# Patient Record
Sex: Female | Born: 1995 | Race: White | Hispanic: No | Marital: Single | Smoking: Current some day smoker
Health system: Southern US, Community
[De-identification: ages and names within clinical notes are randomized; demographics above are authoritative.]

## PROBLEM LIST (undated history)

## (undated) ENCOUNTER — Inpatient Hospital Stay (HOSPITAL_COMMUNITY): Payer: Self-pay

## (undated) DIAGNOSIS — Z789 Other specified health status: Secondary | ICD-10-CM

## (undated) DIAGNOSIS — J45909 Unspecified asthma, uncomplicated: Secondary | ICD-10-CM

## (undated) HISTORY — DX: Other specified health status: Z78.9

## (undated) HISTORY — PX: UPPER GI ENDOSCOPY: SHX6162

## (undated) HISTORY — PX: ADENOIDECTOMY: SUR15

## (undated) HISTORY — PX: TONSILLECTOMY: SUR1361

---
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
## 2016-02-07 ENCOUNTER — Encounter (HOSPITAL_COMMUNITY): Payer: Self-pay | Admitting: Emergency Medicine

## 2016-02-07 ENCOUNTER — Emergency Department (HOSPITAL_COMMUNITY)
Admission: EM | Admit: 2016-02-07 | Discharge: 2016-02-07 | Disposition: A | Discharge status: Home / Self Care | Payer: Medicaid Other | Attending: Emergency Medicine | Admitting: Emergency Medicine

## 2016-02-07 DIAGNOSIS — F17210 Nicotine dependence, cigarettes, uncomplicated: Secondary | ICD-10-CM | POA: Diagnosis not present

## 2016-02-07 DIAGNOSIS — B86 Scabies: Secondary | ICD-10-CM | POA: Diagnosis not present

## 2016-02-07 DIAGNOSIS — R21 Rash and other nonspecific skin eruption: Secondary | ICD-10-CM | POA: Diagnosis present

## 2016-02-07 MED ORDER — DIPHENHYDRAMINE HCL 25 MG PO CAPS
25.0000 mg | ORAL_CAPSULE | Freq: Once | ORAL | Status: AC
  Administered 2016-02-07: 25 mg via ORAL
  Filled 2016-02-07: qty 1

## 2016-02-07 MED ORDER — DIPHENHYDRAMINE HCL 25 MG PO CAPS: 25.0000 mg | ORAL_CAPSULE | Freq: Four times a day (QID) | ORAL | 0 refills | Status: DC | PRN

## 2016-02-07 MED ORDER — PERMETHRIN 5 % EX CREA: TOPICAL_CREAM | CUTANEOUS | 0 refills | Status: DC

## 2016-02-07 NOTE — ED Triage Notes (Signed)
PT c/o rash all over x 4-6 months with boyfriend with same symptoms per patient.

## 2016-02-07 NOTE — Discharge Instructions (Signed)
Be sure to wash all clothing and bed lines in hot water after treatment

## 2016-02-07 NOTE — ED Provider Notes (Signed)
[facility]-EMER[facility]ENCY DEPT Provider Note   CSN: [id number] Arrival date & time: 02/07/16  1711  By signing my name below, I, Patricia Vasquez, attest that this documentation has been prepared under the direction and in the presence of Patricia Holmer, PA-C. Electronically Signed: Placido SouLogan Vasquez, ED Scribe. 02/07/16. 7:25 PM.   History   Chief Complaint Chief Complaint  Patient presents with  . Rash    HPI HPI Comments: Patricia Vasquez is a 20 y.o. female who presents to the Emergency Department complaining of constant, moderate, diffuse, rash across her body x 4 months. Pt states the rash began on her BUE and spread across her body. She reports associated itchiness and has healing scratch marks and bruising across her extremities. Pt has applied triple antibiotic and taken benadryl w/o long term relief. Pt denies a hx of scabies. She has a boyfriend who she lives with and is experiencing a similar rash. She denies fever, pain, swelling and sore throat  The history is provided by the patient. No language interpreter was used.   History reviewed. No pertinent past medical history.  Patient Active Problem List   Diagnosis Date Noted  . Breast mass, right 11/05/2013    Past Surgical History:  Procedure Laterality Date  . TONSILLECTOMY      OB History    Gravida Para Term Preterm AB Living   0 0 0 0 0 0   SAB TAB Ectopic Multiple Live Births   0 0 0 0 0       Home Medications    Prior to Admission medications   Not on File    Family History Family History  Problem Relation Age of Onset  . Cancer Paternal Grandfather     lung    Social History Social History  Substance Use Topics  . Smoking status: Current Every Day Smoker    Packs/day: 0.50    Types: Cigarettes  . Smokeless tobacco: Never Used  . Alcohol use No     Allergies   Review of patient's allergies indicates no known allergies.   Review of Systems Review of Systems  Constitutional: Negative for  fever.  HENT: Negative for sore throat and trouble swallowing.   Respiratory: Negative for shortness of breath.   Cardiovascular: Negative for chest pain.  Musculoskeletal: Negative for arthralgias and myalgias.  Skin: Positive for color change, rash and wound.   Physical Exam Updated Vital Signs BP 114/64 (BP Location: Left Arm)   Pulse 78   Temp 98 F (36.7 C) (Oral)   Resp 16   Ht 5\' 2"  (1.575 m)   Wt 100 lb (45.4 kg)   LMP 02/03/2016   SpO2 100%   BMI 18.29 kg/m   Physical Exam  Constitutional: She is oriented to person, place, and time. She appears well-developed and well-nourished.  HENT:  Head: Normocephalic and atraumatic.  Neck: Normal range of motion.  Cardiovascular: Normal rate, regular rhythm and intact distal pulses.   Pulmonary/Chest: Effort normal. No respiratory distress.  Musculoskeletal: Normal range of motion.  Neurological: She is alert and oriented to person, place, and time.  Skin: Skin is warm and dry. Rash noted. There is erythema.  Erythematous papular lesions across most of the body including the webbed spaces of the fingers and toes. Diffuse excoriations also present.  No pustules or vesicles.  Psychiatric: She has a normal mood and affect.  Nursing note and vitals reviewed.  ED Treatments / Results  Labs (all labs ordered are listed, but only  abnormal results are displayed) Labs Reviewed - No data to display  EKG  EKG Interpretation None       Radiology No results found.  Procedures Procedures  DIAGNOSTIC STUDIES: Oxygen Saturation is 100% on RA, normal by my interpretation.    COORDINATION OF CARE: 7:22 PM Discussed next steps with pt. Pt verbalized understanding and is agreeable with the plan.    Medications Ordered in ED Medications - No data to display   Initial Impression / Assessment and Plan / ED Course  I have reviewed the triage vital signs and the nursing notes.  Pertinent labs & imaging results that were  available during my care of the patient were reviewed by me and considered in my medical decision making (see chart for details).  Clinical Course    Pt is well appearing.  Rash c/w scabies.  Pt's significant other also here with similar rash.    I personally performed the services described in this documentation, which was scribed in my presence. The recorded information has been reviewed and is accurate.  Final Clinical Impressions(s) / ED Diagnoses   Final diagnoses:  Scabies    New Prescriptions Discharge Medication List as of 02/07/2016  7:27 PM    START taking these medications   Details  diphenhydrAMINE (BENADRYL) 25 mg capsule Take 1 capsule (25 mg total) by mouth every 6 (six) hours as needed., Starting Mon 02/07/2016, Print    permethrin (ELIMITE) 5 % cream Apply from neck down, massage between fingers and toes.  Leave on for 8-10 hrs. Then wash off.  May be re-applied in 10 days if needed., Print         [name], PA-C 02/09/16 1344    [name], MD 02/09/16 1357

## 2016-07-24 ENCOUNTER — Encounter (INDEPENDENT_AMBULATORY_CARE_PROVIDER_SITE_OTHER): Payer: Self-pay

## 2016-07-24 ENCOUNTER — Ambulatory Visit: Payer: Medicaid Other | Admitting: Adult Health

## 2016-09-07 ENCOUNTER — Encounter: Payer: Self-pay | Admitting: Adult Health

## 2016-09-07 ENCOUNTER — Ambulatory Visit (INDEPENDENT_AMBULATORY_CARE_PROVIDER_SITE_OTHER): Payer: Medicaid Other | Admitting: Adult Health

## 2016-09-07 VITALS — BP 98/60 | HR 72 | Ht 62.0 in | Wt 120.0 lb

## 2016-09-07 DIAGNOSIS — O3680X0 Pregnancy with inconclusive fetal viability, not applicable or unspecified: Secondary | ICD-10-CM

## 2016-09-07 DIAGNOSIS — Z3201 Encounter for pregnancy test, result positive: Secondary | ICD-10-CM | POA: Diagnosis not present

## 2016-09-07 DIAGNOSIS — R110 Nausea: Secondary | ICD-10-CM

## 2016-09-07 DIAGNOSIS — Z3490 Encounter for supervision of normal pregnancy, unspecified, unspecified trimester: Secondary | ICD-10-CM

## 2016-09-07 DIAGNOSIS — F17200 Nicotine dependence, unspecified, uncomplicated: Secondary | ICD-10-CM | POA: Diagnosis not present

## 2016-09-07 DIAGNOSIS — N926 Irregular menstruation, unspecified: Secondary | ICD-10-CM | POA: Diagnosis not present

## 2016-09-07 LAB — POCT URINE PREGNANCY: PREG TEST UR: POSITIVE — AB

## 2016-09-07 MED ORDER — PRENATAL PLUS/IRON 27-1 MG PO TABS: ORAL_TABLET | ORAL | 12 refills | Status: DC

## 2016-09-07 NOTE — Progress Notes (Signed)
Subjective:     Patient ID: Patricia Vasquez, female   DOB: 01/15/1996, 21 y.o.   MRN: [id number]  HPI Patricia Vasquez is a 21 year old white female, in for UPT, has missed a period, has nausea and cramping. No bleeding.  Review of Systems +missed period +cramping +nausea No bleeding Reviewed past medical,surgical, social and family history. Reviewed medications and allergies.     Objective:   Physical Exam BP 98/60 (BP Location: Right Arm, Patient Position: Sitting, Cuff Size: Normal)   Pulse 72   Ht 5\' 2"  (1.575 m)   Wt 120 lb (54.4 kg)   LMP 07/30/2016   BMI 21.95 kg/m    UPT +, about 5+4 weeks by LMP with EDD 05/06/17,Skin warm and dry. Neck: mid line trachea, normal thyroid, good ROM, no lymphadenopathy noted. Lungs: clear to ausculation bilaterally. Cardiovascular: regular rate and rhythm.Abdomen is soft and non tender per NP student.Breasts:no dominate palpable mass, retraction or nipple discharge,has small breasts, but good nipples.  Assessment:     1. Pregnancy test positive   2. Pregnancy, unspecified gestational age   303. Encounter to determine fetal viability of pregnancy, single or unspecified fetus   4. Smoker       Plan:     Meds ordered this encounter  Medications  . Prenatal Vit-Fe Fumarate-FA (PRENATAL PLUS/IRON) 27-1 MG TABS    Sig: Take 1 daily    Dispense:  30 each    Refill:  12    Order Specific Question:   Supervising Provider    Answer:   Duane LopeEURE, LUTHER H [[id number]]  Return in 2 weeks for dating US Eat often  Decrease cigarettes Review handout on first trimester

## 2016-09-07 NOTE — Patient Instructions (Signed)
First Trimester of Pregnancy The first trimester of pregnancy is from week 1 until the end of week 13 (months 1 through 3). A week after a sperm fertilizes an egg, the egg will implant on the wall of the uterus. This embryo will begin to develop into a baby. Genes from you and your partner will form the baby. The female genes will determine whether the baby will be a boy or a girl. At 6-8 weeks, the eyes and face will be formed, and the heartbeat can be seen on ultrasound. At the end of 12 weeks, all the baby's organs will be formed. Now that you are pregnant, you will want to do everything you can to have a healthy baby. Two of the most important things are to get good prenatal care and to follow your health care provider's instructions. Prenatal care is all the medical care you receive before the baby's birth. This care will help prevent, find, and treat any problems during the pregnancy and childbirth. Body changes during your first trimester Your body goes through many changes during pregnancy. The changes vary from woman to woman.  You may gain or lose a couple of pounds at first.  You may feel sick to your stomach (nauseous) and you may throw up (vomit). If the vomiting is uncontrollable, call your health care provider.  You may tire easily.  You may develop headaches that can be relieved by medicines. All medicines should be approved by your health care provider.  You may urinate more often. Painful urination may mean you have a bladder infection.  You may develop heartburn as a result of your pregnancy.  You may develop constipation because certain hormones are causing the muscles that push stool through your intestines to slow down.  You may develop hemorrhoids or swollen veins (varicose veins).  Your breasts may begin to grow larger and become tender. Your nipples may stick out more, and the tissue that surrounds them (areola) may become darker.  Your gums may bleed and may be  sensitive to brushing and flossing.  Dark spots or blotches (chloasma, mask of pregnancy) may develop on your face. This will likely fade after the baby is born.  Your menstrual periods will stop.  You may have a loss of appetite.  You may develop cravings for certain kinds of food.  You may have changes in your emotions from day to day, such as being excited to be pregnant or being concerned that something may go wrong with the pregnancy and baby.  You may have more vivid and strange dreams.  You may have changes in your hair. These can include thickening of your hair, rapid growth, and changes in texture. Some women also have hair loss during or after pregnancy, or hair that feels dry or thin. Your hair will most likely return to normal after your baby is born.  What to expect at prenatal visits During a routine prenatal visit:  You will be weighed to make sure you and the baby are growing normally.  Your blood pressure will be taken.  Your abdomen will be measured to track your baby's growth.  The fetal heartbeat will be listened to between weeks 10 and 14 of your pregnancy.  Test results from any previous visits will be discussed.  Your health care provider may ask you:  How you are feeling.  If you are feeling the baby move.  If you have had any abnormal symptoms, such as leaking fluid, bleeding, severe headaches,   or abdominal cramping.  If you are using any tobacco products, including cigarettes, chewing tobacco, and electronic cigarettes.  If you have any questions.  Other tests that may be performed during your first trimester include:  Blood tests to find your blood type and to check for the presence of any previous infections. The tests will also be used to check for low iron levels (anemia) and protein on red blood cells (Rh antibodies). Depending on your risk factors, or if you previously had diabetes during pregnancy, you may have tests to check for high blood  sugar that affects pregnant women (gestational diabetes).  Urine tests to check for infections, diabetes, or protein in the urine.  An ultrasound to confirm the proper growth and development of the baby.  Fetal screens for spinal cord problems (spina bifida) and Down syndrome.  HIV (human immunodeficiency virus) testing. Routine prenatal testing includes screening for HIV, unless you choose not to have this test.  You may need other tests to make sure you and the baby are doing well.  Follow these instructions at home: Medicines  Follow your health care provider's instructions regarding medicine use. Specific medicines may be either safe or unsafe to take during pregnancy.  Take a prenatal vitamin that contains at least 600 micrograms (mcg) of folic acid.  If you develop constipation, try taking a stool softener if your health care provider approves. Eating and drinking  Eat a balanced diet that includes fresh fruits and vegetables, whole grains, good sources of protein such as meat, eggs, or tofu, and low-fat dairy. Your health care provider will help you determine the amount of weight gain that is right for you.  Avoid raw meat and uncooked cheese. These carry germs that can cause birth defects in the baby.  Eating four or five small meals rather than three large meals a day may help relieve nausea and vomiting. If you start to feel nauseous, eating a few soda crackers can be helpful. Drinking liquids between meals, instead of during meals, also seems to help ease nausea and vomiting.  Limit foods that are high in fat and processed sugars, such as fried and sweet foods.  To prevent constipation: ? Eat foods that are high in fiber, such as fresh fruits and vegetables, whole grains, and beans. ? Drink enough fluid to keep your urine clear or pale yellow. Activity  Exercise only as directed by your health care provider. Most women can continue their usual exercise routine during  pregnancy. Try to exercise for 30 minutes at least 5 days a week. Exercising will help you: ? Control your weight. ? Stay in shape. ? Be prepared for labor and delivery.  Experiencing pain or cramping in the lower abdomen or lower back is a good sign that you should stop exercising. Check with your health care provider before continuing with normal exercises.  Try to avoid standing for long periods of time. Move your legs often if you must stand in one place for a long time.  Avoid heavy lifting.  Wear low-heeled shoes and practice good posture.  You may continue to have sex unless your health care provider tells you not to. Relieving pain and discomfort  Wear a good support bra to relieve breast tenderness.  Take warm sitz baths to soothe any pain or discomfort caused by hemorrhoids. Use hemorrhoid cream if your health care provider approves.  Rest with your legs elevated if you have leg cramps or low back pain.  If you develop   varicose veins in your legs, wear support hose. Elevate your feet for 15 minutes, 3-4 times a day. Limit salt in your diet. Prenatal care  Schedule your prenatal visits by the twelfth week of pregnancy. They are usually scheduled monthly at first, then more often in the last 2 months before delivery.  Write down your questions. Take them to your prenatal visits.  Keep all your prenatal visits as told by your health care provider. This is important. Safety  Wear your seat belt at all times when driving.  Make a list of emergency phone numbers, including numbers for family, friends, the hospital, and police and fire departments. General instructions  Ask your health care provider for a referral to a local prenatal education class. Begin classes no later than the beginning of month 6 of your pregnancy.  Ask for help if you have counseling or nutritional needs during pregnancy. Your health care provider can offer advice or refer you to specialists for help  with various needs.  Do not use hot tubs, steam rooms, or saunas.  Do not douche or use tampons or scented sanitary pads.  Do not cross your legs for long periods of time.  Avoid cat litter boxes and soil used by cats. These carry germs that can cause birth defects in the baby and possibly loss of the fetus by miscarriage or stillbirth.  Avoid all smoking, herbs, alcohol, and medicines not prescribed by your health care provider. Chemicals in these products affect the formation and growth of the baby.  Do not use any products that contain nicotine or tobacco, such as cigarettes and e-cigarettes. If you need help quitting, ask your health care provider. You may receive counseling support and other resources to help you quit.  Schedule a dentist appointment. At home, brush your teeth with a soft toothbrush and be gentle when you floss. Contact a health care provider if:  You have dizziness.  You have mild pelvic cramps, pelvic pressure, or nagging pain in the abdominal area.  You have persistent nausea, vomiting, or diarrhea.  You have a bad smelling vaginal discharge.  You have pain when you urinate.  You notice increased swelling in your face, hands, legs, or ankles.  You are exposed to fifth disease or chickenpox.  You are exposed to German measles (rubella) and have never had it. Get help right away if:  You have a fever.  You are leaking fluid from your vagina.  You have spotting or bleeding from your vagina.  You have severe abdominal cramping or pain.  You have rapid weight gain or loss.  You vomit blood or material that looks like coffee grounds.  You develop a severe headache.  You have shortness of breath.  You have any kind of trauma, such as from a fall or a car accident. Summary  The first trimester of pregnancy is from week 1 until the end of week 13 (months 1 through 3).  Your body goes through many changes during pregnancy. The changes vary from  woman to woman.  You will have routine prenatal visits. During those visits, your health care provider will examine you, discuss any test results you may have, and talk with you about how you are feeling. This information is not intended to replace advice given to you by your health care provider. Make sure you discuss any questions you have with your health care provider. Document Released: 06/06/2001 Document Revised: 05/24/2016 Document Reviewed: 05/24/2016 Elsevier Interactive Patient Education  2017 [employer]   Inc.  

## 2016-09-18 ENCOUNTER — Telehealth: Payer: Self-pay | Admitting: Women's Health

## 2016-09-18 NOTE — Telephone Encounter (Signed)
Pt's mother states the PNV are too big for pt to swallow, I advised she can OTC prenatal gummy vitamins or two Childrens Flintstone vitamins daily.  Pt's mom voiced understanding and states pt is wanting gummies.

## 2016-09-18 NOTE — Telephone Encounter (Signed)
Pt mom called and said the prenatal vitamins are to big for her to swallow is there something else she can take

## 2016-09-21 ENCOUNTER — Ambulatory Visit (INDEPENDENT_AMBULATORY_CARE_PROVIDER_SITE_OTHER): Payer: Medicaid Other

## 2016-09-21 DIAGNOSIS — O3680X0 Pregnancy with inconclusive fetal viability, not applicable or unspecified: Secondary | ICD-10-CM | POA: Diagnosis not present

## 2016-09-21 NOTE — Progress Notes (Addendum)
US 6+1 wks,single IUP w/ys,pos fht 103 bpm,normal left ovary,right ovary was not visualized,right adnexa wnl,crl 4.98 mm,edd 05/16/2017 by [place]

## 2016-10-05 ENCOUNTER — Encounter: Payer: Self-pay | Admitting: Women's Health

## 2016-10-05 ENCOUNTER — Ambulatory Visit: Payer: Medicaid Other | Admitting: *Deleted

## 2016-10-05 ENCOUNTER — Other Ambulatory Visit (INDEPENDENT_AMBULATORY_CARE_PROVIDER_SITE_OTHER): Payer: Medicaid Other

## 2016-10-05 ENCOUNTER — Other Ambulatory Visit: Payer: Self-pay | Admitting: Women's Health

## 2016-10-05 ENCOUNTER — Ambulatory Visit (INDEPENDENT_AMBULATORY_CARE_PROVIDER_SITE_OTHER): Payer: Medicaid Other | Admitting: Women's Health

## 2016-10-05 VITALS — BP 90/52 | HR 86 | Wt 117.0 lb

## 2016-10-05 DIAGNOSIS — O3680X0 Pregnancy with inconclusive fetal viability, not applicable or unspecified: Secondary | ICD-10-CM

## 2016-10-05 DIAGNOSIS — Z3401 Encounter for supervision of normal first pregnancy, first trimester: Secondary | ICD-10-CM

## 2016-10-05 DIAGNOSIS — F17200 Nicotine dependence, unspecified, uncomplicated: Secondary | ICD-10-CM | POA: Diagnosis not present

## 2016-10-05 DIAGNOSIS — Z3A08 8 weeks gestation of pregnancy: Secondary | ICD-10-CM

## 2016-10-05 DIAGNOSIS — Z1389 Encounter for screening for other disorder: Secondary | ICD-10-CM

## 2016-10-05 DIAGNOSIS — O021 Missed abortion: Secondary | ICD-10-CM | POA: Insufficient documentation

## 2016-10-05 DIAGNOSIS — Z331 Pregnant state, incidental: Secondary | ICD-10-CM

## 2016-10-05 DIAGNOSIS — Z3682 Encounter for antenatal screening for nuchal translucency: Secondary | ICD-10-CM

## 2016-10-05 LAB — POCT URINALYSIS DIPSTICK
Blood, UA: NEGATIVE
Glucose, UA: NEGATIVE
LEUKOCYTES UA: NEGATIVE
NITRITE UA: NEGATIVE
PROTEIN UA: NEGATIVE

## 2016-10-05 NOTE — Progress Notes (Addendum)
  Subjective:  Patricia Vasquez is a 21 y.o. G7P0000 Caucasian female at [redacted]w[redacted]d by 6wk u/s, being seen today for her first obstetrical visit.  Her obstetrical history is significant for smoker: 1/2ppd prior to pregnancy, now 5 cigarettes/day- thinks she can quit on her own; primigravida.  Pregnancy history fully reviewed.  Patient reports pulled muscle in her butt- not sure how she did it. Denies vb, cramping, uti s/s, abnormal/malodorous vag d/c, or vulvovaginal itching/irritation.  BP (!) 90/52   Pulse 86   Wt 117 lb (53.1 kg)   LMP 07/30/2016   BMI 21.40 kg/m   HISTORY: OB History  Gravida Para Term Preterm AB Living  1 0 0 0 0 0  SAB TAB Ectopic Multiple Live Births  0 0 0 0 0    # Outcome Date GA Lbr Len/2nd Weight Sex Delivery Anes PTL Lv  1 Current              Past Medical History:  Diagnosis Date  . Medical history non-contributory    Past Surgical History:  Procedure Laterality Date  . ADENOIDECTOMY    . TONSILLECTOMY     Family History  Problem Relation Age of Onset  . Hypertension Father   . Cancer Maternal Grandfather     luekemia  . Cancer Paternal Grandfather     lung    Exam   System:     General: Well developed & nourished, no acute distress   Skin: Warm & dry, normal coloration and turgor, no rashes   Neurologic: Alert & oriented, normal mood   Cardiovascular: Regular rate & rhythm   Respiratory: Effort & rate normal, LCTAB, acyanotic   Abdomen: Soft, non tender   Extremities: normal strength, tone   Thin prep pap smear n/a <21yo  FHR: unable to see w/ mobile transabdominal u/s, declines vag, will work in w/ amber   Assessment:   Pregnancy: G1P0000 Patient Active Problem List   Diagnosis Date Noted  . Breast mass, right 11/05/2013    Priority: High  . Supervision of normal first pregnancy 10/05/2016  . Smoker 09/07/2016    [redacted]w[redacted]d G1P0000 New OB visit Smoker  Plan:  Initial labs obtained Continue prenatal vitamins Problem list  reviewed and updated Reviewed n/v relief measures and warning s/s to report Reviewed recommended weight gain based on pre-gravid BMI Encouraged well-balanced diet Genetic Screening discussed Integrated Screen: requested Cystic fibrosis screening discussed declined Ultrasound discussed; fetal survey: requested Follow up in 4 weeks for visit and 1st it/nt CCNC completed Smokes 5cigarettes/day, advised cessation, discussed risks to fetus while pregnant, to infant pp, and to herself. Offered QuitlineNC, declined   Marge Duncans CNM, [facility] 10/05/2016 3:09 PM   1615: No FCA on formal u/s w/ amber. Missed Ab w/ CRL measuring [redacted]w[redacted]d. Pt notified. Discussed expectant management vs. cytotec, wants to go home and think about it and let us know. Will call back if decides for cytotec. F/u in 1wk either way. Discussed what to expect w/ miscarriage and gave printed info. Cheral Marker, CNM, [facility] 10/05/2016 5:17 PM

## 2016-10-05 NOTE — Patient Instructions (Addendum)
FACTS YOU SHOULD KNOW  About Early Pregnancy Loss  WHAT IS AN EARLY PREGNANCY LOSS? Once the egg is fertilized with the sperm and begins to develop, it attaches to the lining of the uterus. This early pregnancy tissue may not develop into an embryo (the beginning stage of a baby). Sometimes an embryo does develop but does not continue to grow. These problems can be seen on ultrasound.   MANAGEMNT OF EARLY PREGNANCY LOSS: About 4 out of 100 (0.25%) women will have a pregnancy loss in her lifetime.  One in five pregnancies is found to be an early pregnancy loss.  There are 3 ways to care for an early pregnancy loss:   (1) Surgery, (2) Medicine, (3) Waiting for you to pass the pregnancy on your own. The decision as to how to proceed after being diagnosed with and early pregnancy loss is an individual one.  The decision can be made only after appropriate counseling.  You need to weigh the pros and cons of the 3 choices. Then you can make the choice that works for you.  SURGERY (D&E) . Procedure over in 1 day . Requires being put to sleep . Bleeding may be light . Possible problems during surgery, including injury to womb(uterus) . Care provider has more control Medicine (CYTOTEC) . The complete procedure may take days to weeks . No Surgery . Bleeding may be heavy at times . There may be drug side effects . Patient has more control Waiting . You may choose to wait, in which case your own body may complete the passing of the abnormal early pregnancy on its own in about 2-4 weeks . Your bleeding may be heavy at times . There is a small possibility that you may need surgery if the bleeding is too much or not all of the pregnancy has passed.  CYTOTEC MANAGEMENT Prostaglandins (cytotec) are the most widely used drug for this purpose. They cause the uterus to cramp and contract. You will place the medicine yourself inside your vagina in the privacy of your home. Empting of the uterus should occur  within 3 days but the process may continue for several weeks. The bleeding may seem heavy at times.  INSTRUCTIONS: Take all 4 tablets of cytotec (800mcg total) at one time. This will cause a lot of cramping, you may have bleeding, and pass tissue, then the cramping and bleeding should get better. If you do not pass the tissue, then you can take 4 more tablets of cytotec (800mcg total) 48 hours after your first dose.  You will come back to have your blood drawn to make sure the pregnancy hormones are dropping in 1 week. Please call us if you have any questions.   POSSIBLE SIDE EFFECTS FROM CYTOTEC . Nausea  Vomiting . Diarrhea Fever . Chills  Hot Flashes Side effects  from the process of the early pregnancy loss include: . Cramping  Bleeding . Headaches  Dizziness RISKS: This is a low risk procedure. Less than 1 in 100 women has a complication. An incomplete passage of the early pregnancy may occur. Also, hemorrhage (heavy bleeding) could happen.  Rarely the pregnancy will not be passed completely. Excessively heavy bleeding may occur.  Your doctor may need to perform surgery to empty the uterus (D&E). Afterwards: Everybody will feel differently after the early pregnancy loss completion. You may have soreness or cramps for a day or two. You may have soreness or cramps for day or two.  You may have light   bleeding for up to 2 weeks. You may be as active as you feel like being. If you have any of the following problems you may call [facility] at [phone] or Maternity Admissions Unit at [phone] if it is after hours. . If you have pain that does not get better with pain medication . Bleeding that soaks through 2 thick full-sized sanitary pads in an hour . Cramps that last longer than 2 days . Foul smelling discharge . Fever above 100.4 degrees F Even if you do not have any of these symptoms, you should have a follow-up exam to make sure you are healing properly. Your next normal period will  usually start again in 4-6 week after the loss. You can get pregnant soon after the loss, so use birth control right away. Finally: Make sure all your questions are answered before during and after any procedure. Follow up with medical care and family planning methods.      

## 2016-10-05 NOTE — Addendum Note (Signed)
Addended by: Cheral Marker on: 10/05/2016 05:18 PM   Modules accepted: Orders, Level of Service

## 2016-10-05 NOTE — Progress Notes (Addendum)
[place] 8+1 wks,CRL=7+5wks,NO FHT,crl 13.69 mm,bilat adnexa's wnl,subchorionic hemorrhage 3.2 x 1.7 x 1.5 cm,pt is seeing Kim following ultrasound

## 2016-10-06 ENCOUNTER — Telehealth: Payer: Self-pay | Admitting: *Deleted

## 2016-10-06 LAB — ABO/RH: Rh Factor: POSITIVE

## 2016-10-07 LAB — GC/CHLAMYDIA PROBE AMP
Chlamydia trachomatis, NAA: NEGATIVE
Neisseria gonorrhoeae by PCR: NEGATIVE

## 2016-10-12 ENCOUNTER — Encounter: Payer: Self-pay | Admitting: Women's Health

## 2016-10-12 ENCOUNTER — Ambulatory Visit (INDEPENDENT_AMBULATORY_CARE_PROVIDER_SITE_OTHER): Payer: Medicaid Other | Admitting: Women's Health

## 2016-10-12 VITALS — BP 90/60 | HR 80 | Wt 118.0 lb

## 2016-10-12 DIAGNOSIS — O021 Missed abortion: Secondary | ICD-10-CM | POA: Diagnosis not present

## 2016-10-12 DIAGNOSIS — D649 Anemia, unspecified: Secondary | ICD-10-CM | POA: Diagnosis not present

## 2016-10-12 LAB — POCT HEMOGLOBIN: HEMOGLOBIN: 11.4 g/dL — AB (ref 12.2–16.2)

## 2016-10-12 NOTE — Progress Notes (Signed)
   [facility] Visit  Patient name: Patricia Vasquez MRN [id number]  Date of birth: 1995/08/04  CC & HPI:  Patricia Vasquez is a 21 y.o. G60P0000 Caucasian female presenting today for f/u missed Ab dx 1wk ago at new ob visit. CRL at that time [redacted]w[redacted]d. No cramping/spotting. Wants to give it 1 more week to see if her body will start process. Reports h/o anemia, and red flat itchy 'rash' lower legs in past- not current. Discussed this sounds like possible thrombocytopenia. CBC was not resulted w/ last weeks labs. Does not want labs drawn today. Does want to try for another pregnancy after this is complete.  Patient's last menstrual period was 07/30/2016.  Pertinent History Reviewed:  Medical & Surgical Hx:   Past medical, surgical, family, and social history reviewed in electronic medical record Medications: Reviewed & Updated - see associated section Allergies: Reviewed in electronic medical record  Objective Findings:  Vitals: BP 90/60   Pulse 80   Wt 118 lb (53.5 kg)   LMP 07/30/2016   Breastfeeding? Unknown   BMI 21.58 kg/m  Body mass index is 21.58 kg/m.  Physical Examination: General appearance - alert, well appearing, and in no distress  No results found for this or any previous visit (from the past 24 hour(s)).  Fingerstick Hgb 11.4  Assessment & Plan:  A:   Missed Ab dx 1wk ago w/ CRL [redacted]w[redacted]d  P:  OK to continue 1 more week of expectant management, will f/u in 1wk, if miscarriage still not complete at that time will recommend cytotec.   Discussed what to expect w/ miscarriage, s/s infection, reasons to seek care  Return in about 1 week (around 10/19/2016) for F/U.  Marge Duncans CNM, [facility] 10/12/2016 3:46 PM

## 2016-10-12 NOTE — Addendum Note (Signed)
Addended by: Colen Darling on: 10/12/2016 04:26 PM   Modules accepted: Orders

## 2016-10-12 NOTE — Patient Instructions (Signed)

## 2016-10-19 ENCOUNTER — Ambulatory Visit (INDEPENDENT_AMBULATORY_CARE_PROVIDER_SITE_OTHER): Payer: Medicaid Other | Admitting: Advanced Practice Midwife

## 2016-10-19 ENCOUNTER — Encounter: Payer: Self-pay | Admitting: Advanced Practice Midwife

## 2016-10-19 VITALS — BP 110/80 | HR 80 | Wt 116.0 lb

## 2016-10-19 DIAGNOSIS — O021 Missed abortion: Secondary | ICD-10-CM

## 2016-10-19 LAB — POCT HEMOGLOBIN: Hemoglobin: 12.3 g/dL (ref 12.2–16.2)

## 2016-10-19 MED ORDER — MISOPROSTOL 200 MCG PO TABS: 400.0000 ug | ORAL_TABLET | Freq: Once | ORAL | 1 refills | Status: DC

## 2016-10-19 MED ORDER — KETOROLAC TROMETHAMINE 10 MG PO TABS: 10.0000 mg | ORAL_TABLET | Freq: Four times a day (QID) | ORAL | 0 refills | Status: DC | PRN

## 2016-10-19 NOTE — Progress Notes (Signed)
[facility] Visit  Patient name: Patricia Vasquez MRN [id number]  Date of birth: 1995-12-23  CC & HPI:  Patricia Vasquez is a 21 y.o. Caucasian female presenting today for F/U Missed ab. Bleeding "like a period" for 3 days, terrible cramps. Has not had clots/tissue. Refused pelvic exam  Pertinent History Reviewed:  Medical & Surgical Hx:   Past Medical History:  Diagnosis Date  . Medical history non-contributory    Past Surgical History:  Procedure Laterality Date  . ADENOIDECTOMY    . TONSILLECTOMY     Family History  Problem Relation Age of Onset  . Hypertension Father   . Cancer Maternal Grandfather     luekemia  . Cancer Paternal Grandfather     lung    Current Outpatient Prescriptions:  .  Prenatal Vit-Fe Fumarate-FA (PRENATAL PLUS/IRON) 27-1 MG TABS, Take 1 daily, Disp: 30 each, Rfl: 12 .  ketorolac (TORADOL) 10 MG tablet, Take 1 tablet (10 mg total) by mouth every 6 (six) hours as needed., Disp: 20 tablet, Rfl: 0 .  misoprostol (CYTOTEC) 200 MCG tablet, Take 2 tablets (400 mcg total) by mouth once., Disp: 2 tablet, Rfl: 1 Social History: Reviewed -  reports that she has been smoking Cigarettes.  She has been smoking about 0.50 packs per day. She has never used smokeless tobacco.  Review of Systems:   Constitutional: Negative for fever and chills Eyes: Negative for visual disturbances Respiratory: Negative for shortness of breath, dyspnea Cardiovascular: Negative for chest pain or palpitations  Gastrointestinal: Negative for vomiting, diarrhea and constipation;  Genitourinary: Negative for dysuria and urgency, vaginal irritation or itching Musculoskeletal: Negative for back pain, joint pain, myalgias  Neurological: Negative for dizziness and headaches    Objective Findings:    Physical Examination: General appearance - well appearing, and in no distress Mental status - alert, oriented to person, place, and time Chest:  Normal respiratory  effort Heart - normal rate and regular rhythm Abdomen:  Soft, nontender Pelvic: refused.  Small amount of blood on pad Musculoskeletal:  Normal range of motion without pain Extremities:  No edema    No results found for this or any previous visit (from the past 24 hour(s)).    Assessment & Plan:  A:   Missed AB< possibly having SAB now P:  Rx for cytotec.  PT still wants to wait to see if she completes SAB.  Encouraged to take cytotec if bleeding becomes heavy. Bleeding precautions discussed. HgB 13   Return in about 1 week (around 10/26/2016) for [place] to see if SAB is complete.  CRESENZO-DISHMAN,Yachet Mattson CNM 10/19/2016 5:08 PM

## 2016-10-20 ENCOUNTER — Other Ambulatory Visit: Payer: Self-pay | Admitting: Advanced Practice Midwife

## 2016-10-20 DIAGNOSIS — Z7689 Persons encountering health services in other specified circumstances: Secondary | ICD-10-CM

## 2016-10-25 ENCOUNTER — Ambulatory Visit (INDEPENDENT_AMBULATORY_CARE_PROVIDER_SITE_OTHER): Payer: Medicaid Other

## 2016-10-25 ENCOUNTER — Other Ambulatory Visit: Payer: Self-pay | Admitting: Advanced Practice Midwife

## 2016-10-25 DIAGNOSIS — Z7689 Persons encountering health services in other specified circumstances: Secondary | ICD-10-CM

## 2016-10-25 NOTE — Progress Notes (Signed)
[place] 8+2 wks GS (3.1 x 3.2 x 1.9 cm) within the lower uterine segment,crl 11 mm=7+1 wks,no fht,right ovary not visualized,normal left ovary,small amount of left adnexal fluid,Patricia Vasquez discussed results w/pt,she agreed to take Cytotec and f/u ultrasound in 1 wk.

## 2016-10-26 ENCOUNTER — Ambulatory Visit: Payer: Medicaid Other | Admitting: Women's Health

## 2016-10-28 ENCOUNTER — Inpatient Hospital Stay (HOSPITAL_COMMUNITY)
Admission: AD | Admit: 2016-10-28 | Discharge: 2016-10-28 | Disposition: A | Discharge status: Home / Self Care | Payer: Medicaid Other | Source: Ambulatory Visit | Attending: Obstetrics & Gynecology | Admitting: Obstetrics & Gynecology

## 2016-10-28 DIAGNOSIS — F17210 Nicotine dependence, cigarettes, uncomplicated: Secondary | ICD-10-CM | POA: Diagnosis not present

## 2016-10-28 DIAGNOSIS — O021 Missed abortion: Secondary | ICD-10-CM | POA: Diagnosis not present

## 2016-10-28 DIAGNOSIS — O039 Complete or unspecified spontaneous abortion without complication: Secondary | ICD-10-CM | POA: Diagnosis not present

## 2016-10-28 DIAGNOSIS — N760 Acute vaginitis: Secondary | ICD-10-CM

## 2016-10-28 DIAGNOSIS — B9689 Other specified bacterial agents as the cause of diseases classified elsewhere: Secondary | ICD-10-CM

## 2016-10-28 LAB — URINALYSIS, ROUTINE W REFLEX MICROSCOPIC
Bilirubin Urine: NEGATIVE
Glucose, UA: NEGATIVE mg/dL
Ketones, ur: NEGATIVE mg/dL
LEUKOCYTES UA: NEGATIVE
NITRITE: NEGATIVE
PROTEIN: NEGATIVE mg/dL
SPECIFIC GRAVITY, URINE: 1.009 (ref 1.005–1.030)
WBC, UA: NONE SEEN WBC/hpf (ref 0–5)
pH: 7 (ref 5.0–8.0)

## 2016-10-28 LAB — WET PREP, GENITAL
SPERM: NONE SEEN
TRICH WET PREP: NONE SEEN
YEAST WET PREP: NONE SEEN

## 2016-10-28 MED ORDER — KETOROLAC TROMETHAMINE 10 MG PO TABS: 10.0000 mg | ORAL_TABLET | Freq: Four times a day (QID) | ORAL | 2 refills | Status: DC | PRN

## 2016-10-28 MED ORDER — KETOROLAC TROMETHAMINE 10 MG PO TABS: 10.0000 mg | ORAL_TABLET | Freq: Four times a day (QID) | ORAL | 0 refills | Status: DC | PRN

## 2016-10-28 MED ORDER — METRONIDAZOLE 500 MG PO TABS: 500.0000 mg | ORAL_TABLET | Freq: Two times a day (BID) | ORAL | 0 refills | Status: DC

## 2016-10-28 MED ORDER — METRONIDAZOLE 500 MG PO TABS: 500.0000 mg | ORAL_TABLET | Freq: Two times a day (BID) | ORAL | 2 refills | Status: DC

## 2016-10-28 NOTE — MAU Note (Signed)
Onset of vaginal discharge since yesterday with odor, has taken the Cytotec.

## 2016-10-28 NOTE — Discharge Instructions (Signed)

## 2016-10-28 NOTE — MAU Provider Note (Signed)
History   G1  @ 8.5 wks in with c/o miscarriage. Was seen by Dr. Emelda FearFerguson and given cytotec for missed Ab but has not taken yet. States been bleeding x 2 wks. Bleeding is sm amt and is dark. Pt states not changed her pad in over 24 hours.  CSN: [id number]  Arrival date & time 10/28/16  0945   None     No chief complaint on file.   HPI  Past Medical History:  Diagnosis Date  . Medical history non-contributory     Past Surgical History:  Procedure Laterality Date  . ADENOIDECTOMY    . TONSILLECTOMY      Family History  Problem Relation Age of Onset  . Hypertension Father   . Cancer Maternal Grandfather     luekemia  . Cancer Paternal Grandfather     lung    Social History  Substance Use Topics  . Smoking status: Current Every Day Smoker    Packs/day: 0.50    Types: Cigarettes  . Smokeless tobacco: Never Used  . Alcohol use No    OB History    Gravida Para Term Preterm AB Living   1 0 0 0 0 0   SAB TAB Ectopic Multiple Live Births   0 0 0 0 0      Review of Systems  Constitutional: Negative.   HENT: Negative.   Eyes: Negative.   Respiratory: Negative.   Cardiovascular: Negative.   Gastrointestinal: Positive for abdominal pain.  Endocrine: Negative.   Genitourinary: Positive for vaginal bleeding.  Musculoskeletal: Negative.   Skin: Negative.   Neurological: Negative.   Hematological: Negative.   Psychiatric/Behavioral: Negative.     Allergies  Patient has no known allergies.  Home Medications    BP (!) 112/49   Pulse 97   Temp 98.3 F (36.8 C) (Oral)   Resp 16   Ht 5\' 2"  (1.575 m)   Wt 119 lb 0.6 oz (54 kg)   BMI 21.77 kg/m   Physical Exam  Constitutional: She is oriented to person, place, and time. She appears well-developed and well-nourished.  HENT:  Head: Normocephalic.  Eyes: Pupils are equal, round, and reactive to light.  Neck: Normal range of motion.  Cardiovascular: Normal rate, regular rhythm, normal heart sounds and intact  distal pulses.   Pulmonary/Chest: Effort normal and breath sounds normal.  Abdominal: Soft. Bowel sounds are normal.  Genitourinary: Vagina normal and uterus normal.  Musculoskeletal: Normal range of motion.  Neurological: She is alert and oriented to person, place, and time. She has normal reflexes.  Skin: Skin is warm and dry.  Psychiatric: She has a normal mood and affect. Her behavior is normal. Judgment and thought content normal.    MAU Course  Procedures (including critical care time)  Labs Reviewed  WET PREP, GENITAL  URINALYSIS, ROUTINE W REFLEX MICROSCOPIC  RPR  GC/CHLAMYDIA PROBE AMP ([place]   No results found.   1. Miscarriage    1. BV   MDM  G1  @ 8.5 wks in with c/o miscarriage. Was seen by Dr. Emelda FearFerguson and given cytotec for missed Ab but has not taken yet. States been bleeding x 2 wks. Bleeding is sm amt and is dark. Pt states not changed her pad in over 24 hours.  Wet prep pos for clue . GC and chla done. Stressed importance of changing pads every 3-4 hours to prevent odors. VSS, Heart RRR, LCTAB, abd soft and non tender. sm amt of  dark vag bleeding noted. Will tx for BV. reiterated good hygiene practice. D/c home to f/u at [facility] as needed.

## 2016-10-29 LAB — RPR: RPR: NONREACTIVE

## 2016-10-30 LAB — GC/CHLAMYDIA PROBE AMP (~~LOC~~) NOT AT ARMC
Chlamydia: NEGATIVE
Neisseria Gonorrhea: NEGATIVE

## 2016-10-31 ENCOUNTER — Other Ambulatory Visit: Payer: Self-pay | Admitting: Advanced Practice Midwife

## 2016-10-31 DIAGNOSIS — O039 Complete or unspecified spontaneous abortion without complication: Secondary | ICD-10-CM

## 2016-11-01 ENCOUNTER — Other Ambulatory Visit: Payer: Medicaid Other

## 2016-11-02 ENCOUNTER — Other Ambulatory Visit: Payer: Self-pay | Admitting: Advanced Practice Midwife

## 2016-11-02 ENCOUNTER — Other Ambulatory Visit: Payer: Medicaid Other

## 2016-11-02 ENCOUNTER — Ambulatory Visit (INDEPENDENT_AMBULATORY_CARE_PROVIDER_SITE_OTHER): Payer: Medicaid Other

## 2016-11-02 ENCOUNTER — Encounter: Payer: Medicaid Other | Admitting: Women's Health

## 2016-11-02 DIAGNOSIS — O034 Incomplete spontaneous abortion without complication: Secondary | ICD-10-CM

## 2016-11-02 DIAGNOSIS — O039 Complete or unspecified spontaneous abortion without complication: Secondary | ICD-10-CM

## 2016-11-02 NOTE — Progress Notes (Signed)
PELVIC US TV: NO retained products of conception visualized,trace of simple fluid w/in the lower uterine segment,normal ovaries bilat,small amount of simple cul de sac fluid

## 2016-12-09 ENCOUNTER — Emergency Department (HOSPITAL_COMMUNITY)
Admission: EM | Admit: 2016-12-09 | Discharge: 2016-12-09 | Disposition: A | Discharge status: Home Health | Payer: Medicaid Other | Attending: Emergency Medicine | Admitting: Emergency Medicine

## 2016-12-09 ENCOUNTER — Encounter (HOSPITAL_COMMUNITY): Payer: Self-pay | Admitting: Adult Health

## 2016-12-09 DIAGNOSIS — F17210 Nicotine dependence, cigarettes, uncomplicated: Secondary | ICD-10-CM | POA: Insufficient documentation

## 2016-12-09 DIAGNOSIS — H6122 Impacted cerumen, left ear: Secondary | ICD-10-CM | POA: Insufficient documentation

## 2016-12-09 DIAGNOSIS — H9202 Otalgia, left ear: Secondary | ICD-10-CM | POA: Diagnosis present

## 2016-12-09 MED ORDER — CARBAMIDE PEROXIDE 6.5 % OT SOLN
10.0000 [drp] | Freq: Once | OTIC | Status: AC
  Administered 2016-12-09: 10 [drp] via OTIC
  Filled 2016-12-09: qty 15

## 2016-12-09 MED ORDER — HYDROGEN PEROXIDE 3 % EX SOLN
CUTANEOUS | Status: AC
  Filled 2016-12-09: qty 473

## 2016-12-09 NOTE — ED Triage Notes (Signed)
Presents with left ear pain began yesterday after swimming two days ago. Bilateral ears with cerumen impaction.

## 2016-12-09 NOTE — Discharge Instructions (Signed)
Apply 5-10 drops of the debrox to the left ear canal up to 3 times a day as needed.  Return here for any worsening symptoms.  You can also contact the ENT doctor listed if needed. Tylenol or ibuprofen if needed for pain

## 2016-12-11 NOTE — ED Provider Notes (Signed)
[facility]PT Provider Note   CSN: [id number] Arrival date & time: 12/09/16  1553     History   Chief Complaint Chief Complaint  Patient presents with  . Otalgia    HPI Patricia Vasquez is a 21 y.o. female.  HPI  Patricia Vasquez is a 21 y.o. female who presents to the Emergency Department complaining of left ear pain for one day.  Describes pain as dull and constant.  Began after swimming.  No alleviating factors.  She denies fever, cough, nasal congestion, decreased hearing and dizziness.  Has not tried medications.   Past Medical History:  Diagnosis Date  . Medical history non-contributory     Patient Active Problem List   Diagnosis Date Noted  . Missed abortion 10/05/2016  . Smoker 09/07/2016  . Breast mass, right 11/05/2013    Past Surgical History:  Procedure Laterality Date  . ADENOIDECTOMY    . TONSILLECTOMY      OB History    Gravida Para Term Preterm AB Living   1 0 0 0 0 0   SAB TAB Ectopic Multiple Live Births   0 0 0 0 0       Home Medications    Prior to Admission medications   Not on File    Family History Family History  Problem Relation Age of Onset  . Hypertension Father   . Cancer Maternal Grandfather        luekemia  . Cancer Paternal Grandfather        lung    Social History Social History  Substance Use Topics  . Smoking status: Current Every Day Smoker    Packs/day: 0.50    Types: Cigarettes  . Smokeless tobacco: Never Used  . Alcohol use No     Allergies   Patient has no known allergies.   Review of Systems Review of Systems  Constitutional: Negative for activity change, appetite change, chills and fever.  HENT: Positive for ear pain. Negative for congestion, facial swelling, rhinorrhea, sore throat and trouble swallowing.   Eyes: Negative for visual disturbance.  Respiratory: Negative for cough, shortness of breath, wheezing and stridor.   Gastrointestinal: Negative for abdominal pain, nausea and  vomiting.  Musculoskeletal: Negative for neck pain and neck stiffness.  Skin: Negative.  Negative for rash.  Neurological: Negative for dizziness, weakness, numbness and headaches.  Hematological: Negative for adenopathy.  Psychiatric/Behavioral: Negative for confusion.  All other systems reviewed and are negative.    Physical Exam Updated Vital Signs BP 109/70   Pulse 65   Temp 98.5 F (36.9 C) (Oral)   Resp 18   Wt 54.4 kg (120 lb)   LMP 11/23/2016 (Approximate)   SpO2 100%   Breastfeeding? No   BMI 21.95 kg/m   Physical Exam  Constitutional: She is oriented to person, place, and time. She appears well-developed and well-nourished. No distress.  HENT:  Head: Normocephalic and atraumatic.  Mouth/Throat: Uvula is midline, oropharynx is clear and moist and mucous membranes are normal. No uvula swelling. No oropharyngeal exudate.  Left TM not visualized due to dry appearing cerumen.  No edema or erythema of the canal. Right TM poorly visualized due to cerumen, but appears normal  Neck: Normal range of motion. Neck supple.  Cardiovascular: Normal rate, regular rhythm, normal heart sounds and intact distal pulses.   No murmur heard. Pulmonary/Chest: Effort normal and breath sounds normal. No stridor. No respiratory distress.  Musculoskeletal: Normal range of motion.  Lymphadenopathy:  She has no cervical adenopathy.  Neurological: She is alert and oriented to person, place, and time. No sensory deficit. Coordination normal.  Skin: Skin is warm and dry. No rash noted.  Psychiatric: She has a normal mood and affect.  Nursing note and vitals reviewed.    ED Treatments / Results  Labs (all labs ordered are listed, but only abnormal results are displayed) Labs Reviewed - No data to display  EKG  EKG Interpretation None       Radiology No results found.  Procedures Procedures (including critical care time)  Medications Ordered in ED Medications  carbamide  peroxide (DEBROX) 6.5 % OTIC (EAR) solution 10 drop (10 drops Left EAR Given 12/09/16 1626)     Initial Impression / Assessment and Plan / ED Course  I have reviewed the triage vital signs and the nursing notes.  Pertinent labs & imaging results that were available during my care of the patient were reviewed by me and considered in my medical decision making (see chart for details).     Debrox applied. Left ear irrigated with saline and peroxide.  Small amt of cerumen removed, pain improved.  Still unable to visualize the left TM very well.  Debrox dispensed, pt agrees to continue medication at home to further loosen the wax.  Return precautions discussed.  Final Clinical Impressions(s) / ED Diagnoses   Final diagnoses:  Impacted cerumen of left ear    New Prescriptions There are no discharge medications for this patient.    Pauline Ausriplett, Deretha Ertle, PA-C 12/11/16 2219    Raeford RazorKohut, Stephen, MD 12/14/16 1054

## 2017-02-16 ENCOUNTER — Encounter: Payer: Self-pay | Admitting: Obstetrics and Gynecology

## 2017-02-16 ENCOUNTER — Ambulatory Visit (INDEPENDENT_AMBULATORY_CARE_PROVIDER_SITE_OTHER): Payer: Medicaid Other | Admitting: Obstetrics and Gynecology

## 2017-02-16 VITALS — BP 116/62 | HR 112 | Ht 62.0 in | Wt 127.2 lb

## 2017-02-16 DIAGNOSIS — Z207 Contact with and (suspected) exposure to pediculosis, acariasis and other infestations: Secondary | ICD-10-CM

## 2017-02-16 DIAGNOSIS — Z2089 Contact with and (suspected) exposure to other communicable diseases: Secondary | ICD-10-CM

## 2017-02-16 MED ORDER — PERMETHRIN 1 % EX LOTN: 1.0000 "application " | TOPICAL_LOTION | Freq: Once | CUTANEOUS | 0 refills | Status: AC

## 2017-02-16 NOTE — Progress Notes (Signed)
°   [facility] Visit  02/16/2017            Patient name: Patricia Vasquez MRN [id number]  Date of birth: [birth year]  CC & HPI:  Patricia Vasquez is a 21 y.o. female presenting today for possible scabies. Pt has associated symptoms of itching between fingers, left calf, arms and legs. She says her boyfriend has scabies, but is unsure whether he is being treated. She lives with him currently.  ROS:  ROS +itching on extremeties -fever  All systems are negative except as noted in the HPI and PMH.    Pertinent History Reviewed:   Reviewed:   Medical         Past Medical History:  Diagnosis Date   Medical history non-contributory                               Surgical Hx:    Past Surgical History:  Procedure Laterality Date   ADENOIDECTOMY     TONSILLECTOMY     Medications: Reviewed & Updated - see associated section                      No current outpatient prescriptions on file.   Social History: Reviewed -  reports that she has been smoking Cigarettes.  She has been smoking about 0.50 packs per day. She has never used smokeless tobacco.  Objective Findings:  Vitals: There were no vitals taken for this visit.  Physical Examination: General appearance - alert, well appearing, and in no distress Mental status - alert, oriented to person, place, and time Pelvic - not indicated    Assessment & Plan:   A:  1. Possible scabies  P:  1. Permethrin lotion prescribed     By signing my name below, I, Izna Ahmed, attest that this documentation has been prepared under the direction and in the presence of Tilda Burrow, MD. Electronically Signed: Redge Gainer, Medical Scribe. 02/16/17. 1:09 PM.  I personally performed the services described in this documentation, which was SCRIBED in my presence. The recorded information has been reviewed and considered accurate. It has been edited as necessary during review. Tilda Burrow, MD

## 2017-02-21 DIAGNOSIS — Z2089 Contact with and (suspected) exposure to other communicable diseases: Secondary | ICD-10-CM | POA: Insufficient documentation

## 2017-02-21 DIAGNOSIS — Z207 Contact with and (suspected) exposure to pediculosis, acariasis and other infestations: Secondary | ICD-10-CM | POA: Insufficient documentation

## 2017-04-24 ENCOUNTER — Encounter (HOSPITAL_COMMUNITY): Payer: Self-pay | Admitting: *Deleted

## 2017-04-24 ENCOUNTER — Emergency Department (HOSPITAL_COMMUNITY): Payer: No Typology Code available for payment source

## 2017-04-24 ENCOUNTER — Emergency Department (HOSPITAL_COMMUNITY)
Admission: EM | Admit: 2017-04-24 | Discharge: 2017-04-24 | Disposition: A | Discharge status: Home / Self Care | Payer: No Typology Code available for payment source | Attending: Emergency Medicine | Admitting: Emergency Medicine

## 2017-04-24 DIAGNOSIS — Y999 Unspecified external cause status: Secondary | ICD-10-CM | POA: Diagnosis not present

## 2017-04-24 DIAGNOSIS — S161XXA Strain of muscle, fascia and tendon at neck level, initial encounter: Secondary | ICD-10-CM | POA: Diagnosis not present

## 2017-04-24 DIAGNOSIS — Y92410 Unspecified street and highway as the place of occurrence of the external cause: Secondary | ICD-10-CM | POA: Diagnosis not present

## 2017-04-24 DIAGNOSIS — Y9389 Activity, other specified: Secondary | ICD-10-CM | POA: Diagnosis not present

## 2017-04-24 DIAGNOSIS — F17210 Nicotine dependence, cigarettes, uncomplicated: Secondary | ICD-10-CM | POA: Diagnosis not present

## 2017-04-24 DIAGNOSIS — S199XXA Unspecified injury of neck, initial encounter: Secondary | ICD-10-CM | POA: Diagnosis present

## 2017-04-24 LAB — POC URINE PREG, ED: Preg Test, Ur: NEGATIVE

## 2017-04-24 MED ORDER — IBUPROFEN 600 MG PO TABS: 600.0000 mg | ORAL_TABLET | Freq: Four times a day (QID) | ORAL | 0 refills | Status: DC | PRN

## 2017-04-24 MED ORDER — CYCLOBENZAPRINE HCL 10 MG PO TABS: 10.0000 mg | ORAL_TABLET | Freq: Two times a day (BID) | ORAL | 0 refills | Status: DC | PRN
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
## 2017-04-24 NOTE — Discharge Instructions (Signed)
Apply ice packs on/off to your neck.  Follow-up with your primary provider for recheck in one week if the pain is not improving
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
## 2017-05-01 ENCOUNTER — Ambulatory Visit: Payer: Medicaid Other | Admitting: Adult Health

## 2017-05-08 ENCOUNTER — Other Ambulatory Visit: Payer: Self-pay

## 2017-05-08 ENCOUNTER — Ambulatory Visit (INDEPENDENT_AMBULATORY_CARE_PROVIDER_SITE_OTHER): Payer: Medicaid Other | Admitting: Adult Health

## 2017-05-08 ENCOUNTER — Encounter: Payer: Self-pay | Admitting: Adult Health

## 2017-05-08 VITALS — BP 94/56 | HR 80 | Resp 18 | Ht 62.0 in | Wt 126.0 lb

## 2017-05-08 DIAGNOSIS — Z3A01 Less than 8 weeks gestation of pregnancy: Secondary | ICD-10-CM

## 2017-05-08 DIAGNOSIS — O3680X0 Pregnancy with inconclusive fetal viability, not applicable or unspecified: Secondary | ICD-10-CM

## 2017-05-08 DIAGNOSIS — Z8759 Personal history of other complications of pregnancy, childbirth and the puerperium: Secondary | ICD-10-CM | POA: Diagnosis not present

## 2017-05-08 DIAGNOSIS — N926 Irregular menstruation, unspecified: Secondary | ICD-10-CM | POA: Diagnosis not present

## 2017-05-08 DIAGNOSIS — Z3201 Encounter for pregnancy test, result positive: Secondary | ICD-10-CM

## 2017-05-08 LAB — POCT URINE PREGNANCY: PREG TEST UR: POSITIVE — AB

## 2017-05-08 MED ORDER — OB COMPLETE PETITE 35-5-1-200 MG PO CAPS: ORAL_CAPSULE | ORAL | 12 refills | Status: DC

## 2017-05-08 NOTE — Progress Notes (Signed)
Subjective:     Patient ID: Patricia Vasquez, female   DOB: 02/28/1996, 21 y.o.   MRN: [id number]  HPI [name] is a 21 year old white female in for UPT, she has missed a period.She had miscarriage in May.   Review of Systems +missed period +nausea Reviewed past medical,surgical, social and family history. Reviewed medications and allergies.     Objective:   Physical Exam BP (!) 94/56 (BP Location: Right Arm, Patient Position: Sitting, Cuff Size: Normal)   Pulse 80   Resp 18   Ht 5\' 2"  (1.575 m)   Wt 126 lb (57.2 kg)   LMP 03/24/2017 (Exact Date)   BMI 23.05 kg/m UPT +, about 6+3 weeks by LMP with EDD 12/29/17.Skin warm and dry. Neck: mid line trachea, normal thyroid, good ROM, no lymphadenopathy noted. Lungs: clear to ausculation bilaterally. Cardiovascular: regular rate and rhythm.Abdomen is soft and non tender. Blood type B+.    Assessment:     1. Positive pregnancy test   2. Less than [redacted] weeks gestation of pregnancy   3. Encounter to determine fetal viability of pregnancy, single or unspecified fetus       Plan:     Check QHCG and progesterone Meds ordered this encounter  Medications  . Prenat-FeCbn-FeAspGl-FA-Omega (OB COMPLETE PETITE) 35-5-1-200 MG CAPS    Sig: Take 1 daily    Dispense:  30 capsule    Refill:  12    Order Specific Question:   Supervising Provider    Answer:   Lazaro ArmsEURE, LUTHER H [[id number]]  Return in 1 week for dating US Review handouts on first trimester and by [facility] often

## 2017-05-08 NOTE — Patient Instructions (Signed)
First Trimester of Pregnancy The first trimester of pregnancy is from week 1 until the end of week 13 (months 1 through 3). A week after a sperm fertilizes an egg, the egg will implant on the wall of the uterus. This embryo will begin to develop into a baby. Genes from you and your partner will form the baby. The female genes will determine whether the baby will be a boy or a girl. At 6-8 weeks, the eyes and face will be formed, and the heartbeat can be seen on ultrasound. At the end of 12 weeks, all the baby's organs will be formed. Now that you are pregnant, you will want to do everything you can to have a healthy baby. Two of the most important things are to get good prenatal care and to follow your health care provider's instructions. Prenatal care is all the medical care you receive before the baby's birth. This care will help prevent, find, and treat any problems during the pregnancy and childbirth. Body changes during your first trimester Your body goes through many changes during pregnancy. The changes vary from woman to woman.  You may gain or lose a couple of pounds at first.  You may feel sick to your stomach (nauseous) and you may throw up (vomit). If the vomiting is uncontrollable, call your health care provider.  You may tire easily.  You may develop headaches that can be relieved by medicines. All medicines should be approved by your health care provider.  You may urinate more often. Painful urination may mean you have a bladder infection.  You may develop heartburn as a result of your pregnancy.  You may develop constipation because certain hormones are causing the muscles that push stool through your intestines to slow down.  You may develop hemorrhoids or swollen veins (varicose veins).  Your breasts may begin to grow larger and become tender. Your nipples may stick out more, and the tissue that surrounds them (areola) may become darker.  Your gums may bleed and may be  sensitive to brushing and flossing.  Dark spots or blotches (chloasma, mask of pregnancy) may develop on your face. This will likely fade after the baby is born.  Your menstrual periods will stop.  You may have a loss of appetite.  You may develop cravings for certain kinds of food.  You may have changes in your emotions from day to day, such as being excited to be pregnant or being concerned that something may go wrong with the pregnancy and baby.  You may have more vivid and strange dreams.  You may have changes in your hair. These can include thickening of your hair, rapid growth, and changes in texture. Some women also have hair loss during or after pregnancy, or hair that feels dry or thin. Your hair will most likely return to normal after your baby is born.  What to expect at prenatal visits During a routine prenatal visit:  You will be weighed to make sure you and the baby are growing normally.  Your blood pressure will be taken.  Your abdomen will be measured to track your baby's growth.  The fetal heartbeat will be listened to between weeks 10 and 14 of your pregnancy.  Test results from any previous visits will be discussed.  Your health care provider may ask you:  How you are feeling.  If you are feeling the baby move.  If you have had any abnormal symptoms, such as leaking fluid, bleeding, severe headaches,   or abdominal cramping.  If you are using any tobacco products, including cigarettes, chewing tobacco, and electronic cigarettes.  If you have any questions.  Other tests that may be performed during your first trimester include:  Blood tests to find your blood type and to check for the presence of any previous infections. The tests will also be used to check for low iron levels (anemia) and protein on red blood cells (Rh antibodies). Depending on your risk factors, or if you previously had diabetes during pregnancy, you may have tests to check for high blood  sugar that affects pregnant women (gestational diabetes).  Urine tests to check for infections, diabetes, or protein in the urine.  An ultrasound to confirm the proper growth and development of the baby.  Fetal screens for spinal cord problems (spina bifida) and Down syndrome.  HIV (human immunodeficiency virus) testing. Routine prenatal testing includes screening for HIV, unless you choose not to have this test.  You may need other tests to make sure you and the baby are doing well.  Follow these instructions at home: Medicines  Follow your health care provider's instructions regarding medicine use. Specific medicines may be either safe or unsafe to take during pregnancy.  Take a prenatal vitamin that contains at least 600 micrograms (mcg) of folic acid.  If you develop constipation, try taking a stool softener if your health care provider approves. Eating and drinking  Eat a balanced diet that includes fresh fruits and vegetables, whole grains, good sources of protein such as meat, eggs, or tofu, and low-fat dairy. Your health care provider will help you determine the amount of weight gain that is right for you.  Avoid raw meat and uncooked cheese. These carry germs that can cause birth defects in the baby.  Eating four or five small meals rather than three large meals a day may help relieve nausea and vomiting. If you start to feel nauseous, eating a few soda crackers can be helpful. Drinking liquids between meals, instead of during meals, also seems to help ease nausea and vomiting.  Limit foods that are high in fat and processed sugars, such as fried and sweet foods.  To prevent constipation: ? Eat foods that are high in fiber, such as fresh fruits and vegetables, whole grains, and beans. ? Drink enough fluid to keep your urine clear or pale yellow. Activity  Exercise only as directed by your health care provider. Most women can continue their usual exercise routine during  pregnancy. Try to exercise for 30 minutes at least 5 days a week. Exercising will help you: ? Control your weight. ? Stay in shape. ? Be prepared for labor and delivery.  Experiencing pain or cramping in the lower abdomen or lower back is a good sign that you should stop exercising. Check with your health care provider before continuing with normal exercises.  Try to avoid standing for long periods of time. Move your legs often if you must stand in one place for a long time.  Avoid heavy lifting.  Wear low-heeled shoes and practice good posture.  You may continue to have sex unless your health care provider tells you not to. Relieving pain and discomfort  Wear a good support bra to relieve breast tenderness.  Take warm sitz baths to soothe any pain or discomfort caused by hemorrhoids. Use hemorrhoid cream if your health care provider approves.  Rest with your legs elevated if you have leg cramps or low back pain.  If you develop   varicose veins in your legs, wear support hose. Elevate your feet for 15 minutes, 3-4 times a day. Limit salt in your diet. Prenatal care  Schedule your prenatal visits by the twelfth week of pregnancy. They are usually scheduled monthly at first, then more often in the last 2 months before delivery.  Write down your questions. Take them to your prenatal visits.  Keep all your prenatal visits as told by your health care provider. This is important. Safety  Wear your seat belt at all times when driving.  Make a list of emergency phone numbers, including numbers for family, friends, the hospital, and police and fire departments. General instructions  Ask your health care provider for a referral to a local prenatal education class. Begin classes no later than the beginning of month 6 of your pregnancy.  Ask for help if you have counseling or nutritional needs during pregnancy. Your health care provider can offer advice or refer you to specialists for help  with various needs.  Do not use hot tubs, steam rooms, or saunas.  Do not douche or use tampons or scented sanitary pads.  Do not cross your legs for long periods of time.  Avoid cat litter boxes and soil used by cats. These carry germs that can cause birth defects in the baby and possibly loss of the fetus by miscarriage or stillbirth.  Avoid all smoking, herbs, alcohol, and medicines not prescribed by your health care provider. Chemicals in these products affect the formation and growth of the baby.  Do not use any products that contain nicotine or tobacco, such as cigarettes and e-cigarettes. If you need help quitting, ask your health care provider. You may receive counseling support and other resources to help you quit.  Schedule a dentist appointment. At home, brush your teeth with a soft toothbrush and be gentle when you floss. Contact a health care provider if:  You have dizziness.  You have mild pelvic cramps, pelvic pressure, or nagging pain in the abdominal area.  You have persistent nausea, vomiting, or diarrhea.  You have a bad smelling vaginal discharge.  You have pain when you urinate.  You notice increased swelling in your face, hands, legs, or ankles.  You are exposed to fifth disease or chickenpox.  You are exposed to German measles (rubella) and have never had it. Get help right away if:  You have a fever.  You are leaking fluid from your vagina.  You have spotting or bleeding from your vagina.  You have severe abdominal cramping or pain.  You have rapid weight gain or loss.  You vomit blood or material that looks like coffee grounds.  You develop a severe headache.  You have shortness of breath.  You have any kind of trauma, such as from a fall or a car accident. Summary  The first trimester of pregnancy is from week 1 until the end of week 13 (months 1 through 3).  Your body goes through many changes during pregnancy. The changes vary from  woman to woman.  You will have routine prenatal visits. During those visits, your health care provider will examine you, discuss any test results you may have, and talk with you about how you are feeling. This information is not intended to replace advice given to you by your health care provider. Make sure you discuss any questions you have with your health care provider. Document Released: 06/06/2001 Document Revised: 05/24/2016 Document Reviewed: 05/24/2016 Elsevier Interactive Patient Education  2017 [employer]   Inc.  

## 2017-05-09 LAB — PROGESTERONE: PROGESTERONE: 13.6 ng/mL

## 2017-05-09 LAB — BETA HCG QUANT (REF LAB): HCG QUANT: 26168 m[IU]/mL

## 2017-05-15 ENCOUNTER — Ambulatory Visit (INDEPENDENT_AMBULATORY_CARE_PROVIDER_SITE_OTHER): Payer: Medicaid Other

## 2017-05-15 DIAGNOSIS — Z3A01 Less than 8 weeks gestation of pregnancy: Secondary | ICD-10-CM | POA: Diagnosis not present

## 2017-05-15 DIAGNOSIS — O3680X0 Pregnancy with inconclusive fetal viability, not applicable or unspecified: Secondary | ICD-10-CM | POA: Diagnosis not present

## 2017-05-15 NOTE — Progress Notes (Signed)
US 7+3 wks,single IUP w/ys,positive fht 137 bpm,left corpus luteal cyst 2.4 x 2.3 x 2.1 cm,normal right ovary,crl 10.77 mm,EDD 12/29/2017

## 2017-05-30 ENCOUNTER — Encounter: Payer: Self-pay | Admitting: Advanced Practice Midwife

## 2017-05-30 ENCOUNTER — Ambulatory Visit (INDEPENDENT_AMBULATORY_CARE_PROVIDER_SITE_OTHER): Payer: Medicaid Other | Admitting: Advanced Practice Midwife

## 2017-05-30 ENCOUNTER — Ambulatory Visit: Payer: Medicaid Other | Admitting: *Deleted

## 2017-05-30 VITALS — BP 100/60 | HR 95 | Wt 124.0 lb

## 2017-05-30 DIAGNOSIS — Z3A09 9 weeks gestation of pregnancy: Secondary | ICD-10-CM

## 2017-05-30 DIAGNOSIS — Z1389 Encounter for screening for other disorder: Secondary | ICD-10-CM

## 2017-05-30 DIAGNOSIS — Z3481 Encounter for supervision of other normal pregnancy, first trimester: Secondary | ICD-10-CM

## 2017-05-30 DIAGNOSIS — Z3682 Encounter for antenatal screening for nuchal translucency: Secondary | ICD-10-CM

## 2017-05-30 DIAGNOSIS — Z8759 Personal history of other complications of pregnancy, childbirth and the puerperium: Secondary | ICD-10-CM

## 2017-05-30 DIAGNOSIS — Z3490 Encounter for supervision of normal pregnancy, unspecified, unspecified trimester: Secondary | ICD-10-CM | POA: Insufficient documentation

## 2017-05-30 DIAGNOSIS — Z331 Pregnant state, incidental: Secondary | ICD-10-CM

## 2017-05-30 LAB — POCT URINALYSIS DIPSTICK
Glucose, UA: NEGATIVE
Leukocytes, UA: NEGATIVE
NITRITE UA: NEGATIVE
RBC UA: NEGATIVE

## 2017-05-30 MED ORDER — DOXYLAMINE-PYRIDOXINE ER 20-20 MG PO TBCR: 1.0000 | EXTENDED_RELEASE_TABLET | Freq: Two times a day (BID) | ORAL | 6 refills | Status: DC

## 2017-05-30 MED ORDER — DOXYLAMINE-PYRIDOXINE 10-10 MG PO TBEC
DELAYED_RELEASE_TABLET | ORAL | 3 refills | Status: DC
Start: 2017-05-30 — End: 2017-05-30

## 2017-05-30 NOTE — Progress Notes (Signed)
  Subjective:    Patricia Vasquez is a G2P0010 6526w4d being seen today for her first obstetrical visit.  Her obstetrical history is significant for early SAB 6 mo nths ago. .  Pregnancy history fully reviewed.  Patient reports nausea.  Vitals:   05/30/17 1415  BP: 100/60  Pulse: 95  Weight: 124 lb (56.2 kg)    HISTORY: OB History  Gravida Para Term Preterm AB Living  2 0 0 0 1 0  SAB TAB Ectopic Multiple Live Births  1 0 0 0 0    # Outcome Date GA Lbr Len/2nd Weight Sex Delivery Anes PTL Lv  2 Current           1 SAB 09/2016             Past Medical History:  Diagnosis Date  . Medical history non-contributory    Past Surgical History:  Procedure Laterality Date  . ADENOIDECTOMY    . TONSILLECTOMY     Family History  Problem Relation Age of Onset  . Hypertension Father   . Cancer Maternal Grandfather        luekemia  . Cancer Paternal Grandfather        lung     Exam                                      System:     Skin: normal coloration and turgor, no rashes    Neurologic: oriented, normal, normal mood   Extremities: normal strength, tone, and muscle mass   HEENT PERRLA   Mouth/Teeth mucous membranes moist, normal dentition   Neck supple and no masses   Cardiovascular: regular rate and rhythm   Respiratory:  appears well, vitals normal, no respiratory distress, acyanotic   Abdomen: soft, non-tender;  FHR: 160 US        The nature of [facility] with multiple MDs and other Advanced Practice Providers was explained to patient; also emphasized that residents, students are part of our team.  Assessment:    Pregnancy: G2P0010 Patient Active Problem List   Diagnosis Date Noted  . Supervision of normal pregnancy 05/30/2017  . Scabies exposure 02/21/2017  . Missed abortion 10/05/2016  . Smoker 09/07/2016  . Breast mass, right 11/05/2013        Plan:     Initial labs drawn. Continue prenatal vitamins   rx Diclegis Problem list reviewed and updated  Reviewed n/v relief measures and warning s/s to report  Reviewed recommended weight gain based on pre-gravid BMI  Encouraged well-balanced diet Genetic Screening discussed Integrated Screen: requested.  Ultrasound discussed; fetal survey: requested.  Return in about 3 weeks (around 06/20/2017) for LROB, US:NT+1st IT.  CRESENZO-DISHMAN,Zoran Yankee 05/30/2017

## 2017-05-30 NOTE — Patient Instructions (Signed)
 First Trimester of Pregnancy The first trimester of pregnancy is from week 1 until the end of week 12 (months 1 through 3). A week after a sperm fertilizes an egg, the egg will implant on the wall of the uterus. This embryo will begin to develop into a baby. Genes from you and your partner are forming the baby. The female genes determine whether the baby is a boy or a girl. At 6-8 weeks, the eyes and face are formed, and the heartbeat can be seen on ultrasound. At the end of 12 weeks, all the baby's organs are formed.  Now that you are pregnant, you will want to do everything you can to have a healthy baby. Two of the most important things are to get good prenatal care and to follow your health care provider's instructions. Prenatal care is all the medical care you receive before the baby's birth. This care will help prevent, find, and treat any problems during the pregnancy and childbirth. BODY CHANGES Your body goes through many changes during pregnancy. The changes vary from woman to woman.   You may gain or lose a couple of pounds at first.  You may feel sick to your stomach (nauseous) and throw up (vomit). If the vomiting is uncontrollable, call your health care provider.  You may tire easily.  You may develop headaches that can be relieved by medicines approved by your health care provider.  You may urinate more often. Painful urination may mean you have a bladder infection.  You may develop heartburn as a result of your pregnancy.  You may develop constipation because certain hormones are causing the muscles that push waste through your intestines to slow down.  You may develop hemorrhoids or swollen, bulging veins (varicose veins).  Your breasts may begin to grow larger and become tender. Your nipples may stick out more, and the tissue that surrounds them (areola) may become darker.  Your gums may bleed and may be sensitive to brushing and flossing.  Dark spots or blotches  (chloasma, mask of pregnancy) may develop on your face. This will likely fade after the baby is born.  Your menstrual periods will stop.  You may have a loss of appetite.  You may develop cravings for certain kinds of food.  You may have changes in your emotions from day to day, such as being excited to be pregnant or being concerned that something may go wrong with the pregnancy and baby.  You may have more vivid and strange dreams.  You may have changes in your hair. These can include thickening of your hair, rapid growth, and changes in texture. Some women also have hair loss during or after pregnancy, or hair that feels dry or thin. Your hair will most likely return to normal after your baby is born. WHAT TO EXPECT AT YOUR PRENATAL VISITS During a routine prenatal visit:  You will be weighed to make sure you and the baby are growing normally.  Your blood pressure will be taken.  Your abdomen will be measured to track your baby's growth.  The fetal heartbeat will be listened to starting around week 10 or 12 of your pregnancy.  Test results from any previous visits will be discussed. Your health care provider may ask you:  How you are feeling.  If you are feeling the baby move.  If you have had any abnormal symptoms, such as leaking fluid, bleeding, severe headaches, or abdominal cramping.  If you have any questions. Other   tests that may be performed during your first trimester include:  Blood tests to find your blood type and to check for the presence of any previous infections. They will also be used to check for low iron levels (anemia) and Rh antibodies. Later in the pregnancy, blood tests for diabetes will be done along with other tests if problems develop.  Urine tests to check for infections, diabetes, or protein in the urine.  An ultrasound to confirm the proper growth and development of the baby.  An amniocentesis to check for possible genetic problems.  Fetal  screens for spina bifida and Down syndrome.  You may need other tests to make sure you and the baby are doing well. HOME CARE INSTRUCTIONS  Medicines  Follow your health care provider's instructions regarding medicine use. Specific medicines may be either safe or unsafe to take during pregnancy.  Take your prenatal vitamins as directed.  If you develop constipation, try taking a stool softener if your health care provider approves. Diet  Eat regular, well-balanced meals. Choose a variety of foods, such as meat or vegetable-based protein, fish, milk and low-fat dairy products, vegetables, fruits, and whole grain breads and cereals. Your health care provider will help you determine the amount of weight gain that is right for you.  Avoid raw meat and uncooked cheese. These carry germs that can cause birth defects in the baby.  Eating four or five small meals rather than three large meals a day may help relieve nausea and vomiting. If you start to feel nauseous, eating a few soda crackers can be helpful. Drinking liquids between meals instead of during meals also seems to help nausea and vomiting.  If you develop constipation, eat more high-fiber foods, such as fresh vegetables or fruit and whole grains. Drink enough fluids to keep your urine clear or pale yellow. Activity and Exercise  Exercise only as directed by your health care provider. Exercising will help you:  Control your weight.  Stay in shape.  Be prepared for labor and delivery.  Experiencing pain or cramping in the lower abdomen or low back is a good sign that you should stop exercising. Check with your health care provider before continuing normal exercises.  Try to avoid standing for long periods of time. Move your legs often if you must stand in one place for a long time.  Avoid heavy lifting.  Wear low-heeled shoes, and practice good posture.  You may continue to have sex unless your health care provider directs you  otherwise. Relief of Pain or Discomfort  Wear a good support bra for breast tenderness.   Take warm sitz baths to soothe any pain or discomfort caused by hemorrhoids. Use hemorrhoid cream if your health care provider approves.   Rest with your legs elevated if you have leg cramps or low back pain.  If you develop varicose veins in your legs, wear support hose. Elevate your feet for 15 minutes, 3-4 times a day. Limit salt in your diet. Prenatal Care  Schedule your prenatal visits by the twelfth week of pregnancy. They are usually scheduled monthly at first, then more often in the last 2 months before delivery.  Write down your questions. Take them to your prenatal visits.  Keep all your prenatal visits as directed by your health care provider. Safety  Wear your seat belt at all times when driving.  Make a list of emergency phone numbers, including numbers for family, friends, the hospital, and police and fire departments. General   Tips  Ask your health care provider for a referral to a local prenatal education class. Begin classes no later than at the beginning of month 6 of your pregnancy.  Ask for help if you have counseling or nutritional needs during pregnancy. Your health care provider can offer advice or refer you to specialists for help with various needs.  Do not use hot tubs, steam rooms, or saunas.  Do not douche or use tampons or scented sanitary pads.  Do not cross your legs for long periods of time.  Avoid cat litter boxes and soil used by cats. These carry germs that can cause birth defects in the baby and possibly loss of the fetus by miscarriage or stillbirth.  Avoid all smoking, herbs, alcohol, and medicines not prescribed by your health care provider. Chemicals in these affect the formation and growth of the baby.  Schedule a dentist appointment. At home, brush your teeth with a soft toothbrush and be gentle when you floss. SEEK MEDICAL CARE IF:   You have  dizziness.  You have mild pelvic cramps, pelvic pressure, or nagging pain in the abdominal area.  You have persistent nausea, vomiting, or diarrhea.  You have a bad smelling vaginal discharge.  You have pain with urination.  You notice increased swelling in your face, hands, legs, or ankles. SEEK IMMEDIATE MEDICAL CARE IF:   You have a fever.  You are leaking fluid from your vagina.  You have spotting or bleeding from your vagina.  You have severe abdominal cramping or pain.  You have rapid weight gain or loss.  You vomit blood or material that looks like coffee grounds.  You are exposed to German measles and have never had them.  You are exposed to fifth disease or chickenpox.  You develop a severe headache.  You have shortness of breath.  You have any kind of trauma, such as from a fall or a car accident. Document Released: 06/06/2001 Document Revised: 10/27/2013 Document Reviewed: 04/22/2013 ExitCare Patient Information 2015 [employer]. This information is not intended to replace advice given to you by your health care provider. Make sure you discuss any questions you have with your health care provider.   Nausea & Vomiting  Have saltine crackers or pretzels by your bed and eat a few bites before you raise your head out of bed in the morning  Eat small frequent meals throughout the day instead of large meals  Drink plenty of fluids throughout the day to stay hydrated, just don't drink a lot of fluids with your meals.  This can make your stomach fill up faster making you feel sick  Do not brush your teeth right after you eat  Products with real ginger are good for nausea, like ginger ale and ginger hard candy Make sure it says made with real ginger!  Sucking on sour candy like lemon heads is also good for nausea  If your prenatal vitamins make you nauseated, take them at night so you will sleep through the nausea  Sea Bands  If you feel like you need  medicine for the nausea & vomiting please let us know  If you are unable to keep any fluids or food down please let us know   Constipation  Drink plenty of fluid, preferably water, throughout the day  Eat foods high in fiber such as fruits, vegetables, and grains  Exercise, such as walking, is a good way to keep your bowels regular  Drink warm fluids, especially warm   prune juice, or decaf coffee  Eat a 1/2 cup of real oatmeal (not instant), 1/2 cup applesauce, and 1/2-1 cup warm prune juice every day  If needed, you may take Colace (docusate sodium) stool softener once or twice a day to help keep the stool soft. If you are pregnant, wait until you are out of your first trimester (12-14 weeks of pregnancy)  If you still are having problems with constipation, you may take Miralax once daily as needed to help keep your bowels regular.  If you are pregnant, wait until you are out of your first trimester (12-14 weeks of pregnancy)  Safe Medications in Pregnancy   Acne: Benzoyl Peroxide Salicylic Acid  Backache/Headache: Tylenol: 2 regular strength every 4 hours OR              2 Extra strength every 6 hours  Colds/Coughs/Allergies: Benadryl (alcohol free) 25 mg every 6 hours as needed Breath right strips Claritin Cepacol throat lozenges Chloraseptic throat spray Cold-Eeze- up to three times per day Cough drops, alcohol free Flonase (by prescription only) Guaifenesin Mucinex Robitussin DM (plain only, alcohol free) Saline nasal spray/drops Sudafed (pseudoephedrine) & Actifed ** use only after [redacted] weeks gestation and if you do not have high blood pressure Tylenol Vicks Vaporub Zinc lozenges Zyrtec   Constipation: Colace Ducolax suppositories Fleet enema Glycerin suppositories Metamucil Milk of magnesia Miralax Senokot Smooth move tea  Diarrhea: Kaopectate Imodium A-D  *NO pepto Bismol  Hemorrhoids: Anusol Anusol HC Preparation  H Tucks  Indigestion: Tums Maalox Mylanta Zantac  Pepcid  Insomnia: Benadryl (alcohol free) 25mg every 6 hours as needed Tylenol PM Unisom, no Gelcaps  Leg Cramps: Tums MagGel  Nausea/Vomiting:  Bonine Dramamine Emetrol Ginger extract Sea bands Meclizine  Nausea medication to take during pregnancy:  Unisom (doxylamine succinate 25 mg tablets) Take one tablet daily at bedtime. If symptoms are not adequately controlled, the dose can be increased to a maximum recommended dose of two tablets daily (1/2 tablet in the morning, 1/2 tablet mid-afternoon and one at bedtime). Vitamin B6 100mg tablets. Take one tablet twice a day (up to 200 mg per day).  Skin Rashes: Aveeno products Benadryl cream or 25mg every 6 hours as needed Calamine Lotion 1% cortisone cream  Yeast infection: Gyne-lotrimin 7 Monistat 7   **If taking multiple medications, please check labels to avoid duplicating the same active ingredients **take medication as directed on the label ** Do not exceed 4000 mg of tylenol in 24 hours **Do not take medications that contain aspirin or ibuprofen      

## 2017-06-01 LAB — URINE CULTURE: ORGANISM ID, BACTERIA: NO GROWTH

## 2017-06-13 ENCOUNTER — Telehealth: Payer: Self-pay | Admitting: *Deleted

## 2017-06-13 ENCOUNTER — Other Ambulatory Visit: Payer: Self-pay | Admitting: Advanced Practice Midwife

## 2017-06-13 MED ORDER — DOXYLAMINE-PYRIDOXINE 10-10 MG PO TBEC: DELAYED_RELEASE_TABLET | ORAL | 3 refills | Status: DC

## 2017-06-13 NOTE — Telephone Encounter (Signed)
Pharmacy informed PA was done and approved. Approval number [id number].

## 2017-06-13 NOTE — Progress Notes (Signed)
diclegis ordered

## 2017-06-13 NOTE — Telephone Encounter (Signed)
Can you send prescription for Diclegis please?

## 2017-06-21 ENCOUNTER — Telehealth: Payer: Self-pay | Admitting: *Deleted

## 2017-06-21 NOTE — Telephone Encounter (Signed)
Informed pharmacy PA was done and approved for Diclegis. PA number [id number]

## 2017-06-25 ENCOUNTER — Encounter: Payer: Self-pay | Admitting: Obstetrics & Gynecology

## 2017-06-25 ENCOUNTER — Ambulatory Visit (INDEPENDENT_AMBULATORY_CARE_PROVIDER_SITE_OTHER): Payer: Medicaid Other

## 2017-06-25 ENCOUNTER — Ambulatory Visit (INDEPENDENT_AMBULATORY_CARE_PROVIDER_SITE_OTHER): Payer: Medicaid Other | Admitting: Obstetrics & Gynecology

## 2017-06-25 VITALS — BP 100/60 | HR 85 | Wt 132.8 lb

## 2017-06-25 DIAGNOSIS — Z3481 Encounter for supervision of other normal pregnancy, first trimester: Secondary | ICD-10-CM

## 2017-06-25 DIAGNOSIS — Z3682 Encounter for antenatal screening for nuchal translucency: Secondary | ICD-10-CM

## 2017-06-25 DIAGNOSIS — Z331 Pregnant state, incidental: Secondary | ICD-10-CM

## 2017-06-25 DIAGNOSIS — Z1389 Encounter for screening for other disorder: Secondary | ICD-10-CM

## 2017-06-25 DIAGNOSIS — Z3A13 13 weeks gestation of pregnancy: Secondary | ICD-10-CM

## 2017-06-25 LAB — POCT URINALYSIS DIPSTICK
Blood, UA: NEGATIVE
Glucose, UA: NEGATIVE
Ketones, UA: NEGATIVE
LEUKOCYTES UA: NEGATIVE
Nitrite, UA: NEGATIVE
PROTEIN UA: NEGATIVE

## 2017-06-25 NOTE — Progress Notes (Signed)
US 13+2 wks,measurements c/w dates,normal ovaries bilat,NB present,NT 1.3 mm,crl 65.2 mm,fhr 155 bpm,anterior pl gr 0

## 2017-06-25 NOTE — Progress Notes (Signed)
G2P0010 8531w2d Estimated Date of Delivery: 12/29/17  Blood pressure 100/60, pulse 85, weight 132 lb 12.8 oz (60.2 kg), last menstrual period 03/24/2017.   BP weight and urine results all reviewed and noted.  Please refer to the obstetrical flow sheet for the fundal height and fetal heart rate documentation:  Patient reports good fetal movement, denies any bleeding and no rupture of membranes symptoms or regular contractions. Patient is without complaints. All questions were answered.  Orders Placed This Encounter  Procedures  . Integrated 1  . POCT urinalysis dipstick    Plan:  Continued routine obstetrical care, 1st IT today NT sonogram is normal 2nd IT next visit  Return in about 4 weeks (around 07/23/2017) for 2nd IT, LROB.

## 2017-06-26 NOTE — L&D Delivery Note (Signed)
Patient is 22 y.o. G2P0010 2041w6d admitted SOL, augmented with Pitocin. AROM at 1200.  Prenatal course also complicated by positive drug screen on 3/12.  Delivery Note At 7:58 PM a viable female was delivered via Vaginal, Spontaneous (Presentation: vertex ; LOA  ).  Baby presented with thick meconium coming out of both nostrils and mouth. APGAR: 5, 7; weight  pending.   Placenta status: intact,. Cord: 3 VC with no complications: Cord pH: 7.14  NICU present for delivery, cord clamp x2.. Cut by FOB immediately after delivery. infant to NICU team awaiting at infant warmer immediately after cord cut.    Anesthesia:  epidural Episiotomy: None Lacerations: None  Est. Blood Loss (mL):  100  Mom to postpartum.  Baby to Couplet care / Skin to Skin.  Patricia Vasquez 01/04/2018, 8:38 PM

## 2017-07-23 ENCOUNTER — Encounter: Payer: Self-pay | Admitting: Women's Health

## 2017-07-23 ENCOUNTER — Ambulatory Visit (INDEPENDENT_AMBULATORY_CARE_PROVIDER_SITE_OTHER): Payer: Medicaid Other | Admitting: Women's Health

## 2017-07-23 VITALS — BP 90/40 | HR 99 | Wt 137.0 lb

## 2017-07-23 DIAGNOSIS — Z331 Pregnant state, incidental: Secondary | ICD-10-CM

## 2017-07-23 DIAGNOSIS — Z1389 Encounter for screening for other disorder: Secondary | ICD-10-CM

## 2017-07-23 DIAGNOSIS — Z3482 Encounter for supervision of other normal pregnancy, second trimester: Secondary | ICD-10-CM

## 2017-07-23 DIAGNOSIS — Z3402 Encounter for supervision of normal first pregnancy, second trimester: Secondary | ICD-10-CM

## 2017-07-23 DIAGNOSIS — Z3A17 17 weeks gestation of pregnancy: Secondary | ICD-10-CM

## 2017-07-23 DIAGNOSIS — Z1379 Encounter for other screening for genetic and chromosomal anomalies: Secondary | ICD-10-CM

## 2017-07-23 DIAGNOSIS — Z363 Encounter for antenatal screening for malformations: Secondary | ICD-10-CM

## 2017-07-23 LAB — POCT URINALYSIS DIPSTICK
Blood, UA: NEGATIVE
GLUCOSE UA: NEGATIVE
KETONES UA: NEGATIVE
Leukocytes, UA: NEGATIVE
NITRITE UA: NEGATIVE

## 2017-07-23 NOTE — Patient Instructions (Signed)
Patricia Vasquez, I greatly value your feedback.  If you receive a survey following your visit with us today, we appreciate you taking the time to fill it out.  Thanks, Joellyn HaffKim Ainsley Deakins, CNM, WHNP-BC   Second Trimester of Pregnancy The second trimester is from week 14 through week 27 (months 4 through 6). The second trimester is often a time when you feel your best. Your body has adjusted to being pregnant, and you begin to feel better physically. Usually, morning sickness has lessened or quit completely, you may have more energy, and you may have an increase in appetite. The second trimester is also a time when the fetus is growing rapidly. At the end of the sixth month, the fetus is about 9 inches long and weighs about 1 pounds. You will likely begin to feel the baby move (quickening) between 16 and 20 weeks of pregnancy. Body changes during your second trimester Your body continues to go through many changes during your second trimester. The changes vary from woman to woman.  Your weight will continue to increase. You will notice your lower abdomen bulging out.  You may begin to get stretch marks on your hips, abdomen, and breasts.  You may develop headaches that can be relieved by medicines. The medicines should be approved by your health care provider.  You may urinate more often because the fetus is pressing on your bladder.  You may develop or continue to have heartburn as a result of your pregnancy.  You may develop constipation because certain hormones are causing the muscles that push waste through your intestines to slow down.  You may develop hemorrhoids or swollen, bulging veins (varicose veins).  You may have back pain. This is caused by: ? Weight gain. ? Pregnancy hormones that are relaxing the joints in your pelvis. ? A shift in weight and the muscles that support your balance.  Your breasts will continue to grow and they will continue to become tender.  Your gums may bleed  and may be sensitive to brushing and flossing.  Dark spots or blotches (chloasma, mask of pregnancy) may develop on your face. This will likely fade after the baby is born.  A dark line from your belly button to the pubic area (linea nigra) may appear. This will likely fade after the baby is born.  You may have changes in your hair. These can include thickening of your hair, rapid growth, and changes in texture. Some women also have hair loss during or after pregnancy, or hair that feels dry or thin. Your hair will most likely return to normal after your baby is born.  What to expect at prenatal visits During a routine prenatal visit:  You will be weighed to make sure you and the fetus are growing normally.  Your blood pressure will be taken.  Your abdomen will be measured to track your baby's growth.  The fetal heartbeat will be listened to.  Any test results from the previous visit will be discussed.  Your health care provider may ask you:  How you are feeling.  If you are feeling the baby move.  If you have had any abnormal symptoms, such as leaking fluid, bleeding, severe headaches, or abdominal cramping.  If you are using any tobacco products, including cigarettes, chewing tobacco, and electronic cigarettes.  If you have any questions.  Other tests that may be performed during your second trimester include:  Blood tests that check for: ? Low iron levels (anemia). ? High  blood sugar that affects pregnant women (gestational diabetes) between 3 and 28 weeks. ? Rh antibodies. This is to check for a protein on red blood cells (Rh factor).  Urine tests to check for infections, diabetes, or protein in the urine.  An ultrasound to confirm the proper growth and development of the baby.  An amniocentesis to check for possible genetic problems.  Fetal screens for spina bifida and Down syndrome.  HIV (human immunodeficiency virus) testing. Routine prenatal testing includes  screening for HIV, unless you choose not to have this test.  Follow these instructions at home: Medicines  Follow your health care provider's instructions regarding medicine use. Specific medicines may be either safe or unsafe to take during pregnancy.  Take a prenatal vitamin that contains at least 600 micrograms (mcg) of folic acid.  If you develop constipation, try taking a stool softener if your health care provider approves. Eating and drinking  Eat a balanced diet that includes fresh fruits and vegetables, whole grains, good sources of protein such as meat, eggs, or tofu, and low-fat dairy. Your health care provider will help you determine the amount of weight gain that is right for you.  Avoid raw meat and uncooked cheese. These carry germs that can cause birth defects in the baby.  If you have low calcium intake from food, talk to your health care provider about whether you should take a daily calcium supplement.  Limit foods that are high in fat and processed sugars, such as fried and sweet foods.  To prevent constipation: ? Drink enough fluid to keep your urine clear or pale yellow. ? Eat foods that are high in fiber, such as fresh fruits and vegetables, whole grains, and beans. Activity  Exercise only as directed by your health care provider. Most women can continue their usual exercise routine during pregnancy. Try to exercise for 30 minutes at least 5 days a week. Stop exercising if you experience uterine contractions.  Avoid heavy lifting, wear low heel shoes, and practice good posture.  A sexual relationship may be continued unless your health care provider directs you otherwise. Relieving pain and discomfort  Wear a good support bra to prevent discomfort from breast tenderness.  Take warm sitz baths to soothe any pain or discomfort caused by hemorrhoids. Use hemorrhoid cream if your health care provider approves.  Rest with your legs elevated if you have leg cramps  or low back pain.  If you develop varicose veins, wear support hose. Elevate your feet for 15 minutes, 3-4 times a day. Limit salt in your diet. Prenatal Care  Write down your questions. Take them to your prenatal visits.  Keep all your prenatal visits as told by your health care provider. This is important. Safety  Wear your seat belt at all times when driving.  Make a list of emergency phone numbers, including numbers for family, friends, the hospital, and police and fire departments. General instructions  Ask your health care provider for a referral to a local prenatal education class. Begin classes no later than the beginning of month 6 of your pregnancy.  Ask for help if you have counseling or nutritional needs during pregnancy. Your health care provider can offer advice or refer you to specialists for help with various needs.  Do not use hot tubs, steam rooms, or saunas.  Do not douche or use tampons or scented sanitary pads.  Do not cross your legs for long periods of time.  Avoid cat litter boxes and soil  used by cats. These carry germs that can cause birth defects in the baby and possibly loss of the fetus by miscarriage or stillbirth.  Avoid all smoking, herbs, alcohol, and unprescribed drugs. Chemicals in these products can affect the formation and growth of the baby.  Do not use any products that contain nicotine or tobacco, such as cigarettes and e-cigarettes. If you need help quitting, ask your health care provider.  Visit your dentist if you have not gone yet during your pregnancy. Use a soft toothbrush to brush your teeth and be gentle when you floss. Contact a health care provider if:  You have dizziness.  You have mild pelvic cramps, pelvic pressure, or nagging pain in the abdominal area.  You have persistent nausea, vomiting, or diarrhea.  You have a bad smelling vaginal discharge.  You have pain when you urinate. Get help right away if:  You have a  fever.  You are leaking fluid from your vagina.  You have spotting or bleeding from your vagina.  You have severe abdominal cramping or pain.  You have rapid weight gain or weight loss.  You have shortness of breath with chest pain.  You notice sudden or extreme swelling of your face, hands, ankles, feet, or legs.  You have not felt your baby move in over an hour.  You have severe headaches that do not go away when you take medicine.  You have vision changes. Summary  The second trimester is from week 14 through week 27 (months 4 through 6). It is also a time when the fetus is growing rapidly.  Your body goes through many changes during pregnancy. The changes vary from woman to woman.  Avoid all smoking, herbs, alcohol, and unprescribed drugs. These chemicals affect the formation and growth your baby.  Do not use any tobacco products, such as cigarettes, chewing tobacco, and e-cigarettes. If you need help quitting, ask your health care provider.  Contact your health care provider if you have any questions. Keep all prenatal visits as told by your health care provider. This is important. This information is not intended to replace advice given to you by your health care provider. Make sure you discuss any questions you have with your health care provider. Document Released: 06/06/2001 Document Revised: 11/18/2015 Document Reviewed: 08/13/2012 Elsevier Interactive Patient Education  2017 [employer].

## 2017-07-23 NOTE — Progress Notes (Signed)
   LOW-RISK PREGNANCY VISIT Patient name: Patricia Vasquez MRN [id number]  Date of birth: 01/04/1996 Chief Complaint:   Routine Prenatal Visit (2nd IT)  History of Present Illness:   Patricia HaberKayleigh N Legros is a 22 y.o. 682P0010 female at 2023w2d with an Estimated Date of Delivery: 12/29/17 being seen today for ongoing management of a low-risk pregnancy.  Today she reports no complaints.  .  .  Movement: Absent. denies leaking of fluid. Review of Systems:   Pertinent items are noted in HPI Denies abnormal vaginal discharge w/ itching/odor/irritation, headaches, visual changes, shortness of breath, chest pain, abdominal pain, severe nausea/vomiting, or problems with urination or bowel movements unless otherwise stated above. Pertinent History Reviewed:  Reviewed past medical,surgical, social, obstetrical and family history.  Reviewed problem list, medications and allergies. Physical Assessment:   Vitals:   07/23/17 1550  BP: (!) 90/40  Pulse: 99  Weight: 137 lb (62.1 kg)  Body mass index is 25.06 kg/m.        Physical Examination:   General appearance: Well appearing, and in no distress  Mental status: Alert, oriented to person, place, and time  Skin: Warm & dry  Cardiovascular: Normal heart rate noted  Respiratory: Normal respiratory effort, no distress  Abdomen: Soft, gravid, nontender  Pelvic: Cervical exam deferred         Extremities: Edema: None  Fetal Status: Fetal Heart Rate (bpm): 155   Movement: Absent    Results for orders placed or performed in visit on 07/23/17 (from the past 24 hour(s))  POCT urinalysis dipstick   Collection Time: 07/23/17  3:55 PM  Result Value Ref Range   Color, UA     Clarity, UA     Glucose, UA neg    Bilirubin, UA     Ketones, UA neg    Spec Grav, UA  1.010 - 1.025   Blood, UA neg    pH, UA  5.0 - 8.0   Protein, UA trace    Urobilinogen, UA  0.2 or 1.0 E.U./dL   Nitrite, UA neg    Leukocytes, UA Negative Negative   Appearance     Odor        Assessment & Plan:  1) Low-risk pregnancy G2P0010 at 5923w2d with an Estimated Date of Delivery: 12/29/17    Meds: No orders of the defined types were placed in this encounter.  Labs/procedures today: 2nd IT  Plan:  Continue routine obstetrical care   Reviewed: Preterm labor symptoms and general obstetric precautions including but not limited to vaginal bleeding, contractions, leaking of fluid and fetal movement were reviewed in detail with the patient.  All questions were answered  Follow-up: Return in about 2 weeks (around 08/06/2017) for LROB, WU:[id number]:Anatomy.  Orders Placed This Encounter  Procedures  . US OB Comp + 14 Wk  . INTEGRATED 2  . POCT urinalysis dipstick   Marge DuncansBooker, Pavneet Markwood Randall CNM, [facility]NP-BC 07/23/2017 4:21 PM

## 2017-08-06 ENCOUNTER — Other Ambulatory Visit: Payer: Self-pay

## 2017-08-06 ENCOUNTER — Ambulatory Visit (INDEPENDENT_AMBULATORY_CARE_PROVIDER_SITE_OTHER): Payer: Medicaid Other

## 2017-08-06 ENCOUNTER — Ambulatory Visit (INDEPENDENT_AMBULATORY_CARE_PROVIDER_SITE_OTHER): Payer: Medicaid Other | Admitting: Obstetrics and Gynecology

## 2017-08-06 ENCOUNTER — Encounter: Payer: Self-pay | Admitting: Obstetrics and Gynecology

## 2017-08-06 VITALS — BP 108/60 | HR 78 | Wt 138.0 lb

## 2017-08-06 DIAGNOSIS — Z331 Pregnant state, incidental: Secondary | ICD-10-CM

## 2017-08-06 DIAGNOSIS — Z1389 Encounter for screening for other disorder: Secondary | ICD-10-CM

## 2017-08-06 DIAGNOSIS — Z3482 Encounter for supervision of other normal pregnancy, second trimester: Secondary | ICD-10-CM

## 2017-08-06 DIAGNOSIS — Z363 Encounter for antenatal screening for malformations: Secondary | ICD-10-CM

## 2017-08-06 DIAGNOSIS — Z3A19 19 weeks gestation of pregnancy: Secondary | ICD-10-CM

## 2017-08-06 LAB — POCT URINALYSIS DIPSTICK
Blood, UA: NEGATIVE
Glucose, UA: NEGATIVE
KETONES UA: NEGATIVE
Leukocytes, UA: NEGATIVE
NITRITE UA: NEGATIVE
PROTEIN UA: NEGATIVE

## 2017-08-06 NOTE — Progress Notes (Signed)
US 19+2 wks,variable position,anterior pl gr 1,normal ovaries bilat,svp of fluid 6.3 cm,cx 3.3 cm,fhr 152 bpm,efw 291 g,anatomy complete no obvious abnormalities

## 2017-08-06 NOTE — Progress Notes (Signed)
Patient ID: Patricia Vasquez, female   DOB: 08/02/1995, 22 y.o.   MRN: [id number]   LOW-RISK PREGNANCY VISIT Patient name: Patricia Vasquez MRN [id number]  Date of birth: 04/29/1996 Chief Complaint:   Routine Prenatal Visit (u/s today)  History of Present Illness:   Patricia Vasquez is a 22 y.o. 772P0010 female at 6666w2d with an Estimated Date of Delivery: 12/29/17 being seen today for ongoing management of a low-risk pregnancy.  Today she reports no complaints.  . Vag. Bleeding: None.  Movement: Present. denies leaking of fluid. Review of Systems:   Pertinent items are noted in HPI Denies abnormal vaginal discharge w/ itching/odor/irritation, headaches, visual changes, shortness of breath, chest pain, abdominal pain, severe nausea/vomiting, or problems with urination or bowel movements unless otherwise stated above. Pertinent History Reviewed:  Reviewed past medical,surgical, social, obstetrical and family history.  Reviewed problem list, medications and allergies. Physical Assessment:   Vitals:   08/06/17 1220  BP: 108/60  Pulse: 78  Weight: 138 lb (62.6 kg)  Body mass index is 25.24 kg/m.        Physical Examination:   General appearance: Well appearing, and in no distress  Mental status: Alert, oriented to person, place, and time  Skin: Warm & dry  Cardiovascular: Normal heart rate noted  Respiratory: Normal respiratory effort, no distress  Abdomen: Soft, gravid, nontender  Pelvic: Cervical exam deferred         Extremities: Edema: None  Fetal Status: Fetal Heart Rate (bpm): 152us Fundal Height: 17 cm Movement: Present   U0  Results for orders placed or performed in visit on 08/06/17 (from the past 24 hour(s))  POCT urinalysis dipstick   Collection Time: 08/06/17 12:22 PM  Result Value Ref Range   Color, UA     Clarity, UA     Glucose, UA neg    Bilirubin, UA     Ketones, UA neg    Spec Grav, UA  1.010 - 1.025   Blood, UA neg    pH, UA  5.0 - 8.0   Protein, UA neg    Urobilinogen, UA  0.2 or 1.0 E.U./dL   Nitrite, UA neg    Leukocytes, UA Negative Negative   Appearance     Odor      Assessment & Plan:  1) Low-risk pregnancy G2P0010 at 3766w2d with an Estimated Date of Delivery: 12/29/17    Meds: No orders of the defined types were placed in this encounter.  Labs/procedures today: U/S: US 19+2 wks,variable position,anterior pl gr 1,normal ovaries bilat,svp of fluid 6.3 cm,cx 3.3 cm,fhr 152 bpm,efw 291 g,anatomy complete no obvious abnormalities  Plan:  Continue routine obstetrical care              Follow-up: Return in about 4 weeks (around 09/03/2017) for LROB.  Orders Placed This Encounter  Procedures  . POCT urinalysis dipstick   By signing my name below, I, Diona BrownerJennifer Gorman, attest that this documentation has been prepared under the direction and in the presence of Tilda BurrowFerguson, Nelma Phagan V, MD. Electronically Signed: Diona BrownerJennifer Gorman, Medical Scribe. 08/06/17. 12:38 PM.  I personally performed the services described in this documentation, which was SCRIBED in my presence. The recorded information has been reviewed and considered accurate. It has been edited as necessary during review. Tilda BurrowJohn V Veria Stradley, MD

## 2017-08-06 NOTE — Patient Instructions (Addendum)
(  [phone] is the phone number for Pregnancy Classes or hospital tours at [facility].   You will be referred to  [email or web address] for more information on childbirth classes  At this site you may register for classes. You may sign up for a waiting list if classes are full. Please SIGN UP FOR THIS!.   When the waiting list becomes long, sometimes new classes can be added.  PLEASE BE SURE TO SIGN UP

## 2017-08-24 ENCOUNTER — Telehealth: Payer: Self-pay | Admitting: *Deleted

## 2017-08-24 NOTE — Telephone Encounter (Signed)
Attempted to return pts call. Pts mother answered and said that [facility] and her phone was dead. Advised pts mother to have Patricia Vasquez call me back. She states that she has been having pain. Informed her that I needed to speak with the patient to know what is going on. She states the after hours nurse advised her to be seen in the next 2 days. Informed her to have Patricia Vasquez call me and if they have concerns over the weekend they can [facility] for [facility].

## 2017-09-01 ENCOUNTER — Inpatient Hospital Stay (HOSPITAL_COMMUNITY)
Admission: AD | Admit: 2017-09-01 | Discharge: 2017-09-01 | Disposition: A | Discharge status: Home / Self Care | Payer: Medicaid Other | Source: Ambulatory Visit | Attending: Obstetrics & Gynecology | Admitting: Obstetrics & Gynecology

## 2017-09-01 ENCOUNTER — Other Ambulatory Visit: Payer: Self-pay

## 2017-09-01 ENCOUNTER — Encounter (HOSPITAL_COMMUNITY): Payer: Self-pay | Admitting: *Deleted

## 2017-09-01 DIAGNOSIS — R1013 Epigastric pain: Secondary | ICD-10-CM

## 2017-09-01 DIAGNOSIS — Z8249 Family history of ischemic heart disease and other diseases of the circulatory system: Secondary | ICD-10-CM | POA: Insufficient documentation

## 2017-09-01 DIAGNOSIS — O26892 Other specified pregnancy related conditions, second trimester: Secondary | ICD-10-CM | POA: Diagnosis present

## 2017-09-01 DIAGNOSIS — Z87891 Personal history of nicotine dependence: Secondary | ICD-10-CM | POA: Insufficient documentation

## 2017-09-01 DIAGNOSIS — R102 Pelvic and perineal pain: Secondary | ICD-10-CM

## 2017-09-01 DIAGNOSIS — Z3A23 23 weeks gestation of pregnancy: Secondary | ICD-10-CM | POA: Diagnosis not present

## 2017-09-01 DIAGNOSIS — Z881 Allergy status to other antibiotic agents status: Secondary | ICD-10-CM | POA: Diagnosis not present

## 2017-09-01 DIAGNOSIS — Z801 Family history of malignant neoplasm of trachea, bronchus and lung: Secondary | ICD-10-CM | POA: Insufficient documentation

## 2017-09-01 DIAGNOSIS — Z806 Family history of leukemia: Secondary | ICD-10-CM | POA: Insufficient documentation

## 2017-09-01 DIAGNOSIS — Z9889 Other specified postprocedural states: Secondary | ICD-10-CM | POA: Insufficient documentation

## 2017-09-01 DIAGNOSIS — Z3482 Encounter for supervision of other normal pregnancy, second trimester: Secondary | ICD-10-CM

## 2017-09-01 DIAGNOSIS — M545 Low back pain, unspecified: Secondary | ICD-10-CM

## 2017-09-01 DIAGNOSIS — Z79899 Other long term (current) drug therapy: Secondary | ICD-10-CM | POA: Insufficient documentation

## 2017-09-01 DIAGNOSIS — O26899 Other specified pregnancy related conditions, unspecified trimester: Secondary | ICD-10-CM

## 2017-09-01 LAB — URINALYSIS, ROUTINE W REFLEX MICROSCOPIC
Bilirubin Urine: NEGATIVE
Glucose, UA: NEGATIVE mg/dL
Hgb urine dipstick: NEGATIVE
Ketones, ur: 5 mg/dL — AB
Leukocytes, UA: NEGATIVE
Nitrite: NEGATIVE
Protein, ur: NEGATIVE mg/dL
Specific Gravity, Urine: 1.014 (ref 1.005–1.030)
pH: 7 (ref 5.0–8.0)

## 2017-09-01 MED ORDER — CYCLOBENZAPRINE HCL 10 MG PO TABS
10.0000 mg | ORAL_TABLET | Freq: Once | ORAL | Status: AC
  Administered 2017-09-01: 10 mg via ORAL
  Filled 2017-09-01: qty 1

## 2017-09-01 MED ORDER — CYCLOBENZAPRINE HCL 10 MG PO TABS: 10.0000 mg | ORAL_TABLET | Freq: Two times a day (BID) | ORAL | 0 refills | Status: DC | PRN

## 2017-09-01 MED ORDER — COMFORT FIT MATERNITY SUPP MED MISC: 1.0000 | Freq: Every day | 0 refills | Status: DC

## 2017-09-01 MED ORDER — FAMOTIDINE 20 MG PO TABS: 20.0000 mg | ORAL_TABLET | Freq: Two times a day (BID) | ORAL | 2 refills | Status: DC

## 2017-09-01 MED ORDER — GI COCKTAIL ~~LOC~~
30.0000 mL | Freq: Once | ORAL | Status: AC
  Administered 2017-09-01: 30 mL via ORAL
  Filled 2017-09-01: qty 30

## 2017-09-01 NOTE — Discharge Instructions (Signed)

## 2017-09-01 NOTE — MAU Provider Note (Signed)
Chief Complaint:  Abdominal Pain and Back Pain   First Provider Initiated Contact with Patient 09/01/17 0248      HPI: Patricia Vasquez is a 22 y.o. G2P0010 at 38w0dwho presents to maternity admissions reporting abdominal pain and back pain. She reports that abdominal pain has been occurring on and off for two weeks now, she describes the pain as cramping and aching in her upper abdomen. She currently rates the pain 6/10, has taken Tylenol for the pain with little relief. She reports that this occurrence of pain started around midnight. She reports eating 30 minutes prior to the pain starting. She denies history of stomach ulcer or GI issues in the past. She reports vomiting occures after the abdominal pain begins, she vomited once tonight an hour prior to arriving. She reports that the back pain has been occurring for longer than 2 weeks. She describes the pain as aching in her lower back. Denies pain down her legs. She rates the pain a 7/10- has taken Tylenol for pain with little relief.  She reports good fetal movement, denies LOF, vaginal bleeding, vaginal itching/burning, urinary symptoms, h/a, dizziness or fever/chills.    Past Medical History: Past Medical History:  Diagnosis Date  . Medical history non-contributory     Past obstetric history: OB History  Gravida Para Term Preterm AB Living  2 0 0 0 1 0  SAB TAB Ectopic Multiple Live Births  1 0 0 0 0    # Outcome Date GA Lbr Len/2nd Weight Sex Delivery Anes PTL Lv  2 Current           1 SAB 09/2016              Past Surgical History: Past Surgical History:  Procedure Laterality Date  . ADENOIDECTOMY    . TONSILLECTOMY      Family History: Family History  Problem Relation Age of Onset  . Hypertension Father   . Cancer Maternal Grandfather        luekemia  . Cancer Paternal Grandfather        lung    Social History: Social History   Tobacco Use  . Smoking status: Former Smoker    Packs/day: 0.25    Types:  Cigarettes  . Smokeless tobacco: Never Used  Substance Use Topics  . Alcohol use: No  . Drug use: No    Allergies:  Allergies  Allergen Reactions  . Keflex [Cephalexin] Rash    Meds:  Medications Prior to Admission  Medication Sig Dispense Refill Last Dose  . Doxylamine-Pyridoxine 10-10 MG TBEC 2 PO qhs; may take 1po in am and 1po in afternoon prn nausea (Patient not taking: Reported on 07/23/2017) 120 tablet 3 Not Taking  . Prenat-FeCbn-FeAspGl-FA-Omega (OB COMPLETE PETITE) 35-5-1-200 MG CAPS Take 1 daily 30 capsule 12 Taking    ROS:  Review of Systems  Constitutional: Negative.   Respiratory: Negative.   Cardiovascular: Negative.   Gastrointestinal: Positive for abdominal pain and vomiting. Negative for constipation, diarrhea and nausea.  Genitourinary: Negative.   Musculoskeletal: Positive for back pain.  Neurological: Negative.   Psychiatric/Behavioral: Negative.    I have reviewed patient's Past Medical Hx, Surgical Hx, Family Hx, Social Hx, medications and allergies.   Physical Exam   Patient Vitals for the past 24 hrs:  BP Temp Pulse Resp Height Weight  09/01/17 0421 (!) 105/58 - - - - -  09/01/17 0224 (!) 100/34 98.2 F (36.8 C) 94 18 5\' 2"  (1.575 m) 147 lb (  66.7 kg)   Constitutional: Well-developed, well-nourished female in no acute distress.  Cardiovascular: normal rate Respiratory: normal effort GI: Abd soft, non-tender, gravid appropriate for gestational age.  MS: Extremities nontender, no edema, normal ROM Neurologic: Alert and oriented x 4.  GU: Neg CVAT.  CERVICAL EXAM:  Dilation: Closed Effacement (%): Thick Cervical Position: Posterior Exam by:: Lanice ShirtsV Ulys Favia CNM  FHT:  Baseline 145 , moderate variability, accelerations present, no decelerations Contractions: none   Labs: Results for orders placed or performed during the hospital encounter of 09/01/17 (from the past 24 hour(s))  Urinalysis, Routine w reflex microscopic     Status: Abnormal    Collection Time: 09/01/17  2:32 AM  Result Value Ref Range   Color, Urine YELLOW YELLOW   APPearance CLOUDY (A) CLEAR   Specific Gravity, Urine 1.014 1.005 - 1.030   pH 7.0 5.0 - 8.0   Glucose, UA NEGATIVE NEGATIVE mg/dL   Hgb urine dipstick NEGATIVE NEGATIVE   Bilirubin Urine NEGATIVE NEGATIVE   Ketones, ur 5 (A) NEGATIVE mg/dL   Protein, ur NEGATIVE NEGATIVE mg/dL   Nitrite NEGATIVE NEGATIVE   Leukocytes, UA NEGATIVE NEGATIVE   B/Positive/-- (04/12 1705)   MAU Course/MDM: Orders Placed This Encounter  Procedures  . Urinalysis, Routine w reflex microscopic  UA- 5 ketones present, WNL otherwise   Meds ordered this encounter  Medications  . gi cocktail (Maalox,Lidocaine,Donnatal)  . cyclobenzaprine (FLEXERIL) tablet 10 mg  . Elastic Bandages & Supports (COMFORT FIT MATERNITY SUPP MED) MISC    Sig: 1 Device by Does not apply route daily.    Dispense:  1 each    Refill:  0    Order Specific Question:   Supervising Provider    Answer:   Adam PhenixARNOLD, JAMES G [[id number]]  . famotidine (PEPCID) 20 MG tablet    Sig: Take 1 tablet (20 mg total) by mouth 2 (two) times daily.    Dispense:  30 tablet    Refill:  2    Order Specific Question:   Supervising Provider    Answer:   Adam PhenixARNOLD, JAMES G [[id number]]  . cyclobenzaprine (FLEXERIL) 10 MG tablet    Sig: Take 1 tablet (10 mg total) by mouth 2 (two) times daily as needed for muscle spasms.    Dispense:  20 tablet    Refill:  0    Order Specific Question:   Supervising Provider    Answer:   Adam PhenixARNOLD, JAMES G [[id number]]   NST reviewed- reactive for gestational age  Treatments in MAU included 10mg  flexeril and GI cocktail. Patient reports decreased back pain with pain medication and reports abdominal pain is relieved with GI cocktail.   Educated and discussed pathology during pregnancy in congruent to back pain, discussed different positioning that can help with minimizing back pain. Patient reports little activity during the day due to round ligament  pain, reports staying in bed all day and getting up only to use the restroom or to eat. Rx for mat supp belt sent to pharmacy of choice. Educated on the need to get up more frequently, exercise, and stretch muscles by staying active during pregnancy.   Today's evaluation included a work-up for preterm labor which can be life-threatening for both mom and baby.  Pt discharged. Pt stable at time of discharge.   Assessment: 1. Pain of round ligament affecting pregnancy, antepartum   2. Encounter for supervision of other normal pregnancy in second trimester   3. Epigastric cramping   4. Acute bilateral low back  pain without sciatica     Plan: Discharge home Preterm Labor precautions and fetal kick counts Follow up as scheduled in the office on Monday for prenatal visits  Return to MAU as needed for emergencies or worsening symptoms  Rx for flexeril, pepcid, and mat support belt sent to pharmacy of choice   Allergies as of 09/01/2017      Reactions   Keflex [cephalexin] Rash      Medication List    TAKE these medications   COMFORT FIT MATERNITY SUPP MED Misc 1 Device by Does not apply route daily.   cyclobenzaprine 10 MG tablet Commonly known as:  FLEXERIL Take 1 tablet (10 mg total) by mouth 2 (two) times daily as needed for muscle spasms.   Doxylamine-Pyridoxine 10-10 MG Tbec 2 PO qhs; may take 1po in am and 1po in afternoon prn nausea   famotidine 20 MG tablet Commonly known as:  PEPCID Take 1 tablet (20 mg total) by mouth 2 (two) times daily.   OB COMPLETE PETITE 35-5-1-200 MG Caps Take 1 daily       Steward Drone Certified Nurse-Midwife 09/01/2017 5:08 AM

## 2017-09-01 NOTE — MAU Note (Signed)
Having abd cramping in upper abd and back pain in lower back for 2 wks. Pain comes and goes. Vomited once an hour ago. Had this pain last wk and caused vomiting. Everytime I have this pain causes vomiting. Denies LOF or bleeding

## 2017-09-03 ENCOUNTER — Encounter: Payer: Self-pay | Admitting: Women's Health

## 2017-09-03 ENCOUNTER — Ambulatory Visit (INDEPENDENT_AMBULATORY_CARE_PROVIDER_SITE_OTHER): Payer: Medicaid Other | Admitting: Women's Health

## 2017-09-03 VITALS — BP 112/62 | HR 79 | Wt 147.0 lb

## 2017-09-03 DIAGNOSIS — Z113 Encounter for screening for infections with a predominantly sexual mode of transmission: Secondary | ICD-10-CM

## 2017-09-03 DIAGNOSIS — Z1389 Encounter for screening for other disorder: Secondary | ICD-10-CM

## 2017-09-03 DIAGNOSIS — R1011 Right upper quadrant pain: Secondary | ICD-10-CM

## 2017-09-03 DIAGNOSIS — O9989 Other specified diseases and conditions complicating pregnancy, childbirth and the puerperium: Secondary | ICD-10-CM

## 2017-09-03 DIAGNOSIS — Z3A23 23 weeks gestation of pregnancy: Secondary | ICD-10-CM

## 2017-09-03 DIAGNOSIS — Z3482 Encounter for supervision of other normal pregnancy, second trimester: Secondary | ICD-10-CM

## 2017-09-03 DIAGNOSIS — Z331 Pregnant state, incidental: Secondary | ICD-10-CM

## 2017-09-03 DIAGNOSIS — R1013 Epigastric pain: Secondary | ICD-10-CM

## 2017-09-03 LAB — POCT URINALYSIS DIPSTICK
GLUCOSE UA: NEGATIVE
KETONES UA: NEGATIVE
LEUKOCYTES UA: NEGATIVE
Nitrite, UA: NEGATIVE
Protein, UA: NEGATIVE
RBC UA: NEGATIVE

## 2017-09-03 NOTE — Patient Instructions (Addendum)
Patricia Vasquez, I greatly value your feedback.  If you receive a survey following your visit with [place] today, we appreciate you taking the time to fill it out.  Thanks, Joellyn Haff, CNM, WHNP-BC  Gallbladder ultrasound Friday 3/15 at [facility] at 10:30am, be there at 10:15am, nothing to eat or drink after midnight the night before   You will have your sugar test next visit.  Please do not eat or drink anything after midnight the night before you come, not even water.  You will be here for at least two hours.     Call the office [phone]) or go to [facility] if:  You begin to have strong, frequent contractions  Your water breaks.  Sometimes it is a big gush of fluid, sometimes it is just a trickle that keeps getting your panties wet or running down your legs  You have vaginal bleeding.  It is normal to have a small amount of spotting if your cervix was checked.   You don't feel your baby moving like normal.  If you don't, get you something to eat and drink and lay down and focus on feeling your baby move.   If your baby is still not moving like normal, you should call the office or go to [facility].  Second Trimester of Pregnancy The second trimester is from week 13 through week 28, months 4 through 6. The second trimester is often a time when you feel your best. Your body has also adjusted to being pregnant, and you begin to feel better physically. Usually, morning sickness has lessened or quit completely, you may have more energy, and you may have an increase in appetite. The second trimester is also a time when the fetus is growing rapidly. At the end of the sixth month, the fetus is about 9 inches long and weighs about 1 pounds. You will likely begin to feel the baby move (quickening) between 18 and 20 weeks of the pregnancy. BODY CHANGES Your body goes through many changes during pregnancy. The changes vary from woman to woman.   Your weight will continue to increase. You  will notice your lower abdomen bulging out.  You may begin to get stretch marks on your hips, abdomen, and breasts.  You may develop headaches that can be relieved by medicines approved by your health care provider.  You may urinate more often because the fetus is pressing on your bladder.  You may develop or continue to have heartburn as a result of your pregnancy.  You may develop constipation because certain hormones are causing the muscles that push waste through your intestines to slow down.  You may develop hemorrhoids or swollen, bulging veins (varicose veins).  You may have back pain because of the weight gain and pregnancy hormones relaxing your joints between the bones in your pelvis and as a result of a shift in weight and the muscles that support your balance.  Your breasts will continue to grow and be tender.  Your gums may bleed and may be sensitive to brushing and flossing.  Dark spots or blotches (chloasma, mask of pregnancy) may develop on your face. This will likely fade after the baby is born.  A dark line from your belly button to the pubic area (linea nigra) may appear. This will likely fade after the baby is born.  You may have changes in your hair. These can include thickening of your hair, rapid growth, and changes in texture. Some women also  have hair loss during or after pregnancy, or hair that feels dry or thin. Your hair will most likely return to normal after your baby is born. WHAT TO EXPECT AT YOUR PRENATAL VISITS During a routine prenatal visit:  You will be weighed to make sure you and the fetus are growing normally.  Your blood pressure will be taken.  Your abdomen will be measured to track your baby's growth.  The fetal heartbeat will be listened to.  Any test results from the previous visit will be discussed. Your health care provider may ask you:  How you are feeling.  If you are feeling the baby move.  If you have had any abnormal  symptoms, such as leaking fluid, bleeding, severe headaches, or abdominal cramping.  If you have any questions. Other tests that may be performed during your second trimester include:  Blood tests that check for:  Low iron levels (anemia).  Gestational diabetes (between 24 and 28 weeks).  Rh antibodies.  Urine tests to check for infections, diabetes, or protein in the urine.  An ultrasound to confirm the proper growth and development of the baby.  An amniocentesis to check for possible genetic problems.  Fetal screens for spina bifida and Down syndrome. HOME CARE INSTRUCTIONS   Avoid all smoking, herbs, alcohol, and unprescribed drugs. These chemicals affect the formation and growth of the baby.  Follow your health care provider's instructions regarding medicine use. There are medicines that are either safe or unsafe to take during pregnancy.  Exercise only as directed by your health care provider. Experiencing uterine cramps is a good sign to stop exercising.  Continue to eat regular, healthy meals.  Wear a good support bra for breast tenderness.  Do not use hot tubs, steam rooms, or saunas.  Wear your seat belt at all times when driving.  Avoid raw meat, uncooked cheese, cat litter boxes, and soil used by cats. These carry germs that can cause birth defects in the baby.  Take your prenatal vitamins.  Try taking a stool softener (if your health care provider approves) if you develop constipation. Eat more high-fiber foods, such as fresh vegetables or fruit and whole grains. Drink plenty of fluids to keep your urine clear or pale yellow.  Take warm sitz baths to soothe any pain or discomfort caused by hemorrhoids. Use hemorrhoid cream if your health care provider approves.  If you develop varicose veins, wear support hose. Elevate your feet for 15 minutes, 3-4 times a day. Limit salt in your diet.  Avoid heavy lifting, wear low heel shoes, and practice good  posture.  Rest with your legs elevated if you have leg cramps or low back pain.  Visit your dentist if you have not gone yet during your pregnancy. Use a soft toothbrush to brush your teeth and be gentle when you floss.  A sexual relationship may be continued unless your health care provider directs you otherwise.  Continue to go to all your prenatal visits as directed by your health care provider. SEEK MEDICAL CARE IF:   You have dizziness.  You have mild pelvic cramps, pelvic pressure, or nagging pain in the abdominal area.  You have persistent nausea, vomiting, or diarrhea.  You have a bad smelling vaginal discharge.  You have pain with urination. SEEK IMMEDIATE MEDICAL CARE IF:   You have a fever.  You are leaking fluid from your vagina.  You have spotting or bleeding from your vagina.  You have severe abdominal cramping or  pain.  You have rapid weight gain or loss.  You have shortness of breath with chest pain.  You notice sudden or extreme swelling of your face, hands, ankles, feet, or legs.  You have not felt your baby move in over an hour.  You have severe headaches that do not go away with medicine.  You have vision changes. Document Released: 06/06/2001 Document Revised: 06/17/2013 Document Reviewed: 08/13/2012 [facility] Patient Information 2015 [employer] [place]. This information is not intended to replace advice given to you by your health care provider. Make sure you discuss any questions you have with your health care provider.  Low-Fat Diet for Pancreatitis or Gallbladder Conditions A low-fat diet can be helpful if you have pancreatitis or a gallbladder condition. With these conditions, your pancreas and gallbladder have trouble digesting fats. A healthy eating plan with less fat will help rest your pancreas and gallbladder and reduce your symptoms. What do I need to know about this diet?  Eat a low-fat diet. ? Reduce your fat intake to less than 20-30% of  your total daily calories. This is less than 50-60 g of fat per day. ? Remember that you need some fat in your diet. Ask your dietician what your daily goal should be. ? Choose nonfat and low-fat healthy foods. Look for the words "nonfat," "low fat," or "fat free." ? As a guide, look on the label and choose foods with less than 3 g of fat per serving. Eat only one serving.  Avoid alcohol.  Do not smoke. If you need help quitting, talk with your health care provider.  Eat small frequent meals instead of three large heavy meals. What foods can I eat? Grains Include healthy grains and starches such as potatoes, wheat bread, fiber-rich cereal, and brown rice. Choose whole grain options whenever possible. In adults, whole grains should account for 45-65% of your daily calories. Fruits and Vegetables Eat plenty of fruits and vegetables. Fresh fruits and vegetables add fiber to your diet. Meats and Other Protein Sources Eat lean meat such as chicken and pork. Trim any fat off of meat before cooking it. Eggs, fish, and beans are other sources of protein. In adults, these foods should account for 10-35% of your daily calories. Dairy Choose low-fat milk and dairy options. Dairy includes fat and protein, as well as calcium. Fats and Oils Limit high-fat foods such as fried foods, sweets, baked goods, sugary drinks. Other Creamy sauces and condiments, such as mayonnaise, can add extra fat. Think about whether or not you need to use them, or use smaller amounts or low fat options. What foods are not recommended?  High fat foods, such as: ? [employer] CorporationBaked goods. ? Ice cream. ? JamaicaFrench toast. ? Sweet rolls. ? Pizza. ? Cheese bread. ? Foods covered with batter, butter, creamy sauces, or cheese. ? Fried foods. ? Sugary drinks and desserts.  Foods that cause gas or bloating This information is not intended to replace advice given to you by your health care provider. Make sure you discuss any questions you  have with your health care provider. Document Released: 06/17/2013 Document Revised: 11/18/2015 Document Reviewed: 05/26/2013 Elsevier Interactive Patient Education  2017 [employer]

## 2017-09-03 NOTE — Progress Notes (Signed)
LOW-RISK PREGNANCY VISIT Patient name: Patricia Vasquez MRN [id number]  Date of birth: 05/11/1996 Chief Complaint:   [facility] QM:VHQIONGX:stomach ulcers)  History of Present Illness:   Patricia Vasquez is a 22 y.o. 152P0010 female at 2136w2d with an Estimated Date of Delivery: 12/29/17 being seen today for ongoing management of a low-risk pregnancy.  Today she reports went to MAU 3/9 for abd pain w/ vomiting, states she was told could be stomach ulcers, was given rx for pepcid which she hasn't started yet, b/c she wasn't sure if it was ok to take. Reports pain as intermittent, achy pain all across epigastric region x 8-10hrs when it comes, causes n/v, happens after eats fatty/fried meals. Still has gallbladder. States she never had bloodwork for genetic screening, was too afraid of the needle! sVag. Bleeding: None.  Movement: Present. denies leaking of fluid. Review of Systems:   Pertinent items are noted in HPI Denies abnormal vaginal discharge w/ itching/odor/irritation, headaches, visual changes, shortness of breath, chest pain, abdominal pain, severe nausea/vomiting, or problems with urination or bowel movements unless otherwise stated above. Pertinent History Reviewed:  Reviewed past medical,surgical, social, obstetrical and family history.  Reviewed problem list, medications and allergies. Physical Assessment:   Vitals:   09/03/17 1550  BP: 112/62  Pulse: 79  Weight: 147 lb (66.7 kg)  Body mass index is 26.89 kg/m.        Physical Examination:   General appearance: Well appearing, and in no distress  Mental status: Alert, oriented to person, place, and time  Skin: Warm & dry  Cardiovascular: Normal heart rate noted  Respiratory: Normal respiratory effort, no distress  Abdomen: Soft, gravid, nontender  Pelvic: Cervical exam deferred         Extremities: Edema: None  Fetal Status: Fetal Heart Rate (bpm): 145 Fundal Height: 23 cm Movement: Present      Results for orders placed or performed in visit on 09/03/17 (from the past 24 hour(s))  POCT urinalysis dipstick   Collection Time: 09/03/17  3:51 PM  Result Value Ref Range   Color, UA     Clarity, UA     Glucose, UA neg    Bilirubin, UA     Ketones, UA neg    Spec Grav, UA  1.010 - 1.025   Blood, UA neg    pH, UA  5.0 - 8.0   Protein, UA neg    Urobilinogen, UA  0.2 or 1.0 E.U./dL   Nitrite, UA neg    Leukocytes, UA Negative Negative   Appearance     Odor      Assessment & Plan:  1) Low-risk pregnancy G2P0010 at 2136w2d with an Estimated Date of Delivery: 12/29/17   2) Epigastric pain after meals w/ n/v, will get gallbladder u/s, scheduled for 3/15 @ 1030 at [facility], be there at 1015, npo after midnight   Meds: No orders of the defined types were placed in this encounter.  Labs/procedures today: gc/ct, uds  Plan:  Continue routine obstetrical care   Reviewed: Preterm labor symptoms and general obstetric precautions including but not limited to vaginal bleeding, contractions, leaking of fluid and fetal movement were reviewed in detail with the patient.  All questions were answered  Follow-up: Return in about 4 weeks (around 10/01/2017) for LROB, PN2. & PN1 (after pt left, reviewing chart, never had pn1, 1st IT or 2nd IT drawn!)  Orders Placed This Encounter  Procedures  . GC/Chlamydia Probe Amp  .  US Abdomen Limited RUQ  . Pain Management Screening Profile (10S)  . POCT urinalysis dipstick   Cheral Marker CNM, [facility] 09/03/2017 5:04 PM

## 2017-09-06 ENCOUNTER — Encounter: Payer: Self-pay | Admitting: Women's Health

## 2017-09-06 DIAGNOSIS — F1990 Other psychoactive substance use, unspecified, uncomplicated: Secondary | ICD-10-CM | POA: Insufficient documentation

## 2017-09-06 LAB — GC/CHLAMYDIA PROBE AMP
CHLAMYDIA, DNA PROBE: NEGATIVE
Neisseria gonorrhoeae by PCR: NEGATIVE

## 2017-09-06 LAB — PMP SCREEN PROFILE (10S), URINE
Amphetamine Scrn, Ur: NEGATIVE ng/mL
BARBITURATE SCREEN URINE: POSITIVE ng/mL — AB
BENZODIAZEPINE SCREEN, URINE: NEGATIVE ng/mL
CANNABINOIDS UR QL SCN: POSITIVE ng/mL — AB
COCAINE(METAB.)SCREEN, URINE: NEGATIVE ng/mL
Creatinine(Crt), U: 125.5 mg/dL (ref 20.0–300.0)
Methadone Screen, Urine: NEGATIVE ng/mL
OPIATE SCREEN URINE: NEGATIVE ng/mL
OXYCODONE+OXYMORPHONE UR QL SCN: NEGATIVE ng/mL
Ph of Urine: 6.1 (ref 4.5–8.9)
Phencyclidine Qn, Ur: NEGATIVE ng/mL
Propoxyphene Scrn, Ur: NEGATIVE ng/mL

## 2017-09-07 ENCOUNTER — Ambulatory Visit (HOSPITAL_COMMUNITY)
Admission: RE | Admit: 2017-09-07 | Discharge: 2017-09-07 | Disposition: A | Discharge status: Home / Self Care | Payer: Medicaid Other | Source: Ambulatory Visit | Attending: Women's Health | Admitting: Women's Health

## 2017-09-07 DIAGNOSIS — K829 Disease of gallbladder, unspecified: Secondary | ICD-10-CM | POA: Diagnosis not present

## 2017-09-07 DIAGNOSIS — R1011 Right upper quadrant pain: Secondary | ICD-10-CM | POA: Diagnosis present

## 2017-09-07 DIAGNOSIS — O99612 Diseases of the digestive system complicating pregnancy, second trimester: Secondary | ICD-10-CM | POA: Insufficient documentation

## 2017-09-07 DIAGNOSIS — Z3A23 23 weeks gestation of pregnancy: Secondary | ICD-10-CM | POA: Diagnosis not present

## 2017-09-07 DIAGNOSIS — O26892 Other specified pregnancy related conditions, second trimester: Secondary | ICD-10-CM | POA: Insufficient documentation

## 2017-09-10 ENCOUNTER — Telehealth: Payer: Self-pay | Admitting: Women's Health

## 2017-09-10 NOTE — Telephone Encounter (Signed)
LM for pt to return call. Need to discuss gallbladder u/s.  Cheral MarkerKimberly R. Marrietta Thunder, CNM, [facility]NP[facility]BC 09/10/2017 12:34 PM

## 2017-09-12 ENCOUNTER — Telehealth: Payer: Self-pay | Admitting: Women's Health

## 2017-09-12 NOTE — Telephone Encounter (Signed)
LM for pt to return call to discuss gb u/s.  Cheral MarkerKimberly R. Jennesis Ramaswamy, CNM, [facility]NP[facility] 09/12/2017 4:21 PM

## 2017-09-22 DIAGNOSIS — K5900 Constipation, unspecified: Secondary | ICD-10-CM

## 2017-09-22 DIAGNOSIS — O368120 Decreased fetal movements, second trimester, not applicable or unspecified: Secondary | ICD-10-CM

## 2017-09-22 DIAGNOSIS — Z3A36 36 weeks gestation of pregnancy: Secondary | ICD-10-CM

## 2017-09-22 DIAGNOSIS — O99612 Diseases of the digestive system complicating pregnancy, second trimester: Secondary | ICD-10-CM

## 2017-09-23 ENCOUNTER — Encounter (HOSPITAL_COMMUNITY): Payer: Self-pay

## 2017-09-23 ENCOUNTER — Inpatient Hospital Stay (HOSPITAL_COMMUNITY)
Admission: AD | Admit: 2017-09-23 | Discharge: 2017-09-23 | Disposition: A | Discharge status: Home / Self Care | Payer: Medicaid Other | Source: Ambulatory Visit | Attending: Obstetrics & Gynecology | Admitting: Obstetrics & Gynecology

## 2017-09-23 DIAGNOSIS — K5900 Constipation, unspecified: Secondary | ICD-10-CM | POA: Diagnosis not present

## 2017-09-23 DIAGNOSIS — O368120 Decreased fetal movements, second trimester, not applicable or unspecified: Secondary | ICD-10-CM | POA: Diagnosis present

## 2017-09-23 DIAGNOSIS — O26892 Other specified pregnancy related conditions, second trimester: Secondary | ICD-10-CM | POA: Insufficient documentation

## 2017-09-23 DIAGNOSIS — O99612 Diseases of the digestive system complicating pregnancy, second trimester: Secondary | ICD-10-CM

## 2017-09-23 DIAGNOSIS — Z3A26 26 weeks gestation of pregnancy: Secondary | ICD-10-CM | POA: Diagnosis not present

## 2017-09-23 LAB — URINALYSIS, ROUTINE W REFLEX MICROSCOPIC
BILIRUBIN URINE: NEGATIVE
GLUCOSE, UA: NEGATIVE mg/dL
HGB URINE DIPSTICK: NEGATIVE
Ketones, ur: 80 mg/dL — AB
Leukocytes, UA: NEGATIVE
Nitrite: NEGATIVE
PH: 5 (ref 5.0–8.0)
Protein, ur: NEGATIVE mg/dL
SPECIFIC GRAVITY, URINE: 1.027 (ref 1.005–1.030)

## 2017-09-23 NOTE — Discharge Instructions (Signed)
Safe Medications in Pregnancy   Acne: Benzoyl Peroxide Salicylic Acid  Backache/Headache: Tylenol: 2 regular strength every 4 hours OR              2 Extra strength every 6 hours  Colds/Coughs/Allergies: Benadryl (alcohol free) 25 mg every 6 hours as needed Breath right strips Claritin Cepacol throat lozenges Chloraseptic throat spray Cold-Eeze- up to three times per day Cough drops, alcohol free Flonase (by prescription only) Guaifenesin Mucinex Robitussin DM (plain only, alcohol free) Saline nasal spray/drops Sudafed (pseudoephedrine) & Actifed ** use only after [redacted] weeks gestation and if you do not have high blood pressure Tylenol Vicks Vaporub Zinc lozenges Zyrtec   Constipation: Colace Ducolax suppositories Fleet enema Glycerin suppositories Metamucil Milk of magnesia Miralax Senokot Smooth move tea  Diarrhea: Kaopectate Imodium A-D  *NO pepto Bismol  Hemorrhoids: Anusol Anusol HC Preparation H Tucks  Indigestion: Tums Maalox Mylanta Zantac  Pepcid  Insomnia: Benadryl (alcohol free) 25mg  every 6 hours as needed Tylenol PM Unisom, no Gelcaps  Leg Cramps: Tums MagGel  Nausea/Vomiting:  Bonine Dramamine Emetrol Ginger extract Sea bands Meclizine  Nausea medication to take during pregnancy:  Unisom (doxylamine succinate 25 mg tablets) Take one tablet daily at bedtime. If symptoms are not adequately controlled, the dose can be increased to a maximum recommended dose of two tablets daily (1/2 tablet in the morning, 1/2 tablet mid-afternoon and one at bedtime). Vitamin B6 100mg  tablets. Take one tablet twice a day (up to 200 mg per day).  Skin Rashes: Aveeno products Benadryl cream or 25mg  every 6 hours as needed Calamine Lotion 1% cortisone cream  Yeast infection: Gyne-lotrimin 7 Monistat 7  Gum/tooth pain: Anbesol  **If taking multiple medications, please check labels to avoid duplicating the same active ingredients **take  medication as directed on the label ** Do not exceed 4000 mg of tylenol in 24 hours **Do not take medications that contain aspirin or ibuprofen     Constipation, Adult Constipation is when a person has fewer bowel movements in a week than normal, has difficulty having a bowel movement, or has stools that are dry, hard, or larger than normal. Constipation may be caused by an underlying condition. It may become worse with age if a person takes certain medicines and does not take in enough fluids. Follow these instructions at home: Eating and drinking   Eat foods that have a lot of fiber, such as fresh fruits and vegetables, whole grains, and beans.  Limit foods that are high in fat, low in fiber, or overly processed, such as french fries, hamburgers, cookies, candies, and soda.  Drink enough fluid to keep your urine clear or pale yellow. General instructions  Exercise regularly or as told by your health care provider.  Go to the restroom when you have the urge to go. Do not hold it in.  Take over-the-counter and prescription medicines only as told by your health care provider. These include any fiber supplements.  Practice pelvic floor retraining exercises, such as deep breathing while relaxing the lower abdomen and pelvic floor relaxation during bowel movements.  Watch your condition for any changes.  Keep all follow-up visits as told by your health care provider. This is important. Contact a health care provider if:  You have pain that gets worse.  You have a fever.  You do not have a bowel movement after 4 days.  You vomit.  You are not hungry.  You lose weight.  You are bleeding from the anus.  You have thin, pencil-like stools. Get help right away if:  You have a fever and your symptoms suddenly get worse.  You leak stool or have blood in your stool.  Your abdomen is bloated.  You have severe pain in your abdomen.  You feel dizzy or you faint. This  information is not intended to replace advice given to you by your health care provider. Make sure you discuss any questions you have with your health care provider. Document Released: 03/10/2004 Document Revised: 12/31/2015 Document Reviewed: 12/01/2015 Elsevier Interactive Patient Education  2018 [employer]

## 2017-09-23 NOTE — MAU Note (Signed)
Pt here with c/o decreased fetal movement; constipation and nausea. Denies any bleeding or leaking of fluid.

## 2017-10-01 ENCOUNTER — Other Ambulatory Visit: Payer: Medicaid Other

## 2017-10-01 ENCOUNTER — Ambulatory Visit (INDEPENDENT_AMBULATORY_CARE_PROVIDER_SITE_OTHER): Payer: Medicaid Other | Admitting: Women's Health

## 2017-10-01 ENCOUNTER — Encounter: Payer: Self-pay | Admitting: Women's Health

## 2017-10-01 VITALS — BP 90/60 | HR 98 | Wt 152.0 lb

## 2017-10-01 DIAGNOSIS — R1013 Epigastric pain: Secondary | ICD-10-CM

## 2017-10-01 DIAGNOSIS — O9989 Other specified diseases and conditions complicating pregnancy, childbirth and the puerperium: Secondary | ICD-10-CM

## 2017-10-01 DIAGNOSIS — Z3482 Encounter for supervision of other normal pregnancy, second trimester: Secondary | ICD-10-CM

## 2017-10-01 DIAGNOSIS — Z3A27 27 weeks gestation of pregnancy: Secondary | ICD-10-CM

## 2017-10-01 DIAGNOSIS — Z1389 Encounter for screening for other disorder: Secondary | ICD-10-CM

## 2017-10-01 DIAGNOSIS — K828 Other specified diseases of gallbladder: Secondary | ICD-10-CM | POA: Insufficient documentation

## 2017-10-01 DIAGNOSIS — Z131 Encounter for screening for diabetes mellitus: Secondary | ICD-10-CM

## 2017-10-01 DIAGNOSIS — Z331 Pregnant state, incidental: Secondary | ICD-10-CM

## 2017-10-01 LAB — POCT URINALYSIS DIPSTICK
Blood, UA: NEGATIVE
Glucose, UA: NEGATIVE
Ketones, UA: NEGATIVE
LEUKOCYTES UA: NEGATIVE
NITRITE UA: NEGATIVE

## 2017-10-01 NOTE — Patient Instructions (Addendum)
Patricia Vasquez, I greatly value your feedback.  If you receive a survey following your visit with [place] today, we appreciate you taking the time to fill it out.  Thanks, Joellyn Haff, CNM, WHNP-BC   Call the office [phone]) or go to [facility] if:  You begin to have strong, frequent contractions  Your water breaks.  Sometimes it is a big gush of fluid, sometimes it is just a trickle that keeps getting your panties wet or running down your legs  You have vaginal bleeding.  It is normal to have a small amount of spotting if your cervix was checked.   You don't feel your baby moving like normal.  If you don't, get you something to eat and drink and lay down and focus on feeling your baby move.  You should feel at least 10 movements in 2 hours.  If you don't, you should call the office or go to [facility].    Tdap Vaccine  It is recommended that you get the Tdap vaccine during the third trimester of EACH pregnancy to help protect your baby from getting pertussis (whooping cough)  27-36 weeks is the BEST time to do this so that you can pass the protection on to your baby. During pregnancy is better than after pregnancy, but if you are unable to get it during pregnancy it will be offered at the hospital.   You can get this vaccine at the health department or your family doctor  Everyone who will be around your baby should also be up-to-date on their vaccines. Adults (who are not pregnant) only need 1 dose of Tdap during adulthood.   Third Trimester of Pregnancy The third trimester is from week 29 through week 42, months 7 through 9. The third trimester is a time when the fetus is growing rapidly. At the end of the ninth month, the fetus is about 20 inches in length and weighs 6-10 pounds.  BODY CHANGES Your body goes through many changes during pregnancy. The changes vary from woman to woman.   Your weight will continue to increase. You can expect to gain 25-35 pounds (11-16 kg) by  the end of the pregnancy.  You may begin to get stretch marks on your hips, abdomen, and breasts.  You may urinate more often because the fetus is moving lower into your pelvis and pressing on your bladder.  You may develop or continue to have heartburn as a result of your pregnancy.  You may develop constipation because certain hormones are causing the muscles that push waste through your intestines to slow down.  You may develop hemorrhoids or swollen, bulging veins (varicose veins).  You may have pelvic pain because of the weight gain and pregnancy hormones relaxing your joints between the bones in your pelvis. Backaches may result from overexertion of the muscles supporting your posture.  You may have changes in your hair. These can include thickening of your hair, rapid growth, and changes in texture. Some women also have hair loss during or after pregnancy, or hair that feels dry or thin. Your hair will most likely return to normal after your baby is born.  Your breasts will continue to grow and be tender. A yellow discharge may leak from your breasts called colostrum.  Your belly button may stick out.  You may feel short of breath because of your expanding uterus.  You may notice the fetus "dropping," or moving lower in your abdomen.  You may have a bloody  mucus discharge. This usually occurs a few days to a week before labor begins.  Your cervix becomes thin and soft (effaced) near your due date. WHAT TO EXPECT AT YOUR PRENATAL EXAMS  You will have prenatal exams every 2 weeks until week 36. Then, you will have weekly prenatal exams. During a routine prenatal visit:  You will be weighed to make sure you and the fetus are growing normally.  Your blood pressure is taken.  Your abdomen will be measured to track your baby's growth.  The fetal heartbeat will be listened to.  Any test results from the previous visit will be discussed.  You may have a cervical check near your  due date to see if you have effaced. At around 36 weeks, your caregiver will check your cervix. At the same time, your caregiver will also perform a test on the secretions of the vaginal tissue. This test is to determine if a type of bacteria, Group B streptococcus, is present. Your caregiver will explain this further. Your caregiver may ask you:  What your birth plan is.  How you are feeling.  If you are feeling the baby move.  If you have had any abnormal symptoms, such as leaking fluid, bleeding, severe headaches, or abdominal cramping.  If you have any questions. Other tests or screenings that may be performed during your third trimester include:  Blood tests that check for low iron levels (anemia).  Fetal testing to check the health, activity level, and growth of the fetus. Testing is done if you have certain medical conditions or if there are problems during the pregnancy. FALSE LABOR You may feel small, irregular contractions that eventually go away. These are called Braxton Hicks contractions, or false labor. Contractions may last for hours, days, or even weeks before true labor sets in. If contractions come at regular intervals, intensify, or become painful, it is best to be seen by your caregiver.  SIGNS OF LABOR   Menstrual-like cramps.  Contractions that are 5 minutes apart or less.  Contractions that start on the top of the uterus and spread down to the lower abdomen and back.  A sense of increased pelvic pressure or back pain.  A watery or bloody mucus discharge that comes from the vagina. If you have any of these signs before the 37th week of pregnancy, call your caregiver right away. You need to go to the hospital to get checked immediately. HOME CARE INSTRUCTIONS   Avoid all smoking, herbs, alcohol, and unprescribed drugs. These chemicals affect the formation and growth of the baby.  Follow your caregiver's instructions regarding medicine use. There are medicines  that are either safe or unsafe to take during pregnancy.  Exercise only as directed by your caregiver. Experiencing uterine cramps is a good sign to stop exercising.  Continue to eat regular, healthy meals.  Wear a good support bra for breast tenderness.  Do not use hot tubs, steam rooms, or saunas.  Wear your seat belt at all times when driving.  Avoid raw meat, uncooked cheese, cat litter boxes, and soil used by cats. These carry germs that can cause birth defects in the baby.  Take your prenatal vitamins.  Try taking a stool softener (if your caregiver approves) if you develop constipation. Eat more high-fiber foods, such as fresh vegetables or fruit and whole grains. Drink plenty of fluids to keep your urine clear or pale yellow.  Take warm sitz baths to soothe any pain or discomfort caused by hemorrhoids.  Use hemorrhoid cream if your caregiver approves.  If you develop varicose veins, wear support hose. Elevate your feet for 15 minutes, 3-4 times a day. Limit salt in your diet.  Avoid heavy lifting, wear low heal shoes, and practice good posture.  Rest a lot with your legs elevated if you have leg cramps or low back pain.  Visit your dentist if you have not gone during your pregnancy. Use a soft toothbrush to brush your teeth and be gentle when you floss.  A sexual relationship may be continued unless your caregiver directs you otherwise.  Do not travel far distances unless it is absolutely necessary and only with the approval of your caregiver.  Take prenatal classes to understand, practice, and ask questions about the labor and delivery.  Make a trial run to the hospital.  Pack your hospital bag.  Prepare the baby's nursery.  Continue to go to all your prenatal visits as directed by your caregiver. SEEK MEDICAL CARE IF:  You are unsure if you are in labor or if your water has broken.  You have dizziness.  You have mild pelvic cramps, pelvic pressure, or nagging  pain in your abdominal area.  You have persistent nausea, vomiting, or diarrhea.  You have a bad smelling vaginal discharge.  You have pain with urination. SEEK IMMEDIATE MEDICAL CARE IF:   You have a fever.  You are leaking fluid from your vagina.  You have spotting or bleeding from your vagina.  You have severe abdominal cramping or pain.  You have rapid weight loss or gain.  You have shortness of breath with chest pain.  You notice sudden or extreme swelling of your face, hands, ankles, feet, or legs.  You have not felt your baby move in over an hour.  You have severe headaches that do not go away with medicine.  You have vision changes. Document Released: 06/06/2001 Document Revised: 06/17/2013 Document Reviewed: 08/13/2012 [facility] Patient Information 2015 [place], [place]. This information is not intended to replace advice given to you by your health care provider. Make sure you discuss any questions you have with your health care provider.  Low-Fat Diet for Pancreatitis or Gallbladder Conditions A low-fat diet can be helpful if you have pancreatitis or a gallbladder condition. With these conditions, your pancreas and gallbladder have trouble digesting fats. A healthy eating plan with less fat will help rest your pancreas and gallbladder and reduce your symptoms. What do I need to know about this diet?  Eat a low-fat diet. ? Reduce your fat intake to less than 20-30% of your total daily calories. This is less than 50-60 g of fat per day. ? Remember that you need some fat in your diet. Ask your dietician what your daily goal should be. ? Choose nonfat and low-fat healthy foods. Look for the words "nonfat," "low fat," or "fat free." ? As a guide, look on the label and choose foods with less than 3 g of fat per serving. Eat only one serving.  Avoid alcohol.  Do not smoke. If you need help quitting, talk with your health care provider.  Eat small frequent meals instead  of three large heavy meals. What foods can I eat? Grains Include healthy grains and starches such as potatoes, wheat bread, fiber-rich cereal, and brown rice. Choose whole grain options whenever possible. In adults, whole grains should account for 45-65% of your daily calories. Fruits and Vegetables Eat plenty of fruits and vegetables. Fresh fruits and vegetables add fiber  to your diet. Meats and Other Protein Sources Eat lean meat such as chicken and pork. Trim any fat off of meat before cooking it. Eggs, fish, and beans are other sources of protein. In adults, these foods should account for 10-35% of your daily calories. Dairy Choose low-fat milk and dairy options. Dairy includes fat and protein, as well as calcium. Fats and Oils Limit high-fat foods such as fried foods, sweets, baked goods, sugary drinks. Other Creamy sauces and condiments, such as mayonnaise, can add extra fat. Think about whether or not you need to use them, or use smaller amounts or low fat options. What foods are not recommended?  High fat foods, such as: ? [employer] Corporation[employer]aked goods. ? Ice cream. ? JamaicaFrench toast. ? Sweet rolls. ? Pizza. ? Cheese bread. ? Foods covered with batter, butter, creamy sauces, or cheese. ? Fried foods. ? Sugary drinks and desserts.  Foods that cause gas or bloating This information is not intended to replace advice given to you by your health care provider. Make sure you discuss any questions you have with your health care provider. Document Released: 06/17/2013 Document Revised: 11/18/2015 Document Reviewed: 05/26/2013 Elsevier Interactive Patient Education  2017 [employer]

## 2017-10-01 NOTE — Progress Notes (Signed)
   LOW-RISK PREGNANCY VISIT Patient name: Patricia Vasquez MRN [id number]  Date of birth: 05/02/1996 Chief Complaint:   Routine Prenatal Visit  History of Present Illness:   Patricia Vasquez is a 22 y.o. 142P0010 female at 2959w2d with an Estimated Date of Delivery: 12/29/17 being seen today for ongoing management of a low-risk pregnancy.  Today she reports continued RUQ/epigastric pain, same as last visit, gallbladder u/s revealed sludge, no stones/thickening, had tried mutliple times to contact pt to notify of results w/o success. Has yet to do PN1 'afraid of needles'. Contractions: Not present.  .  Movement: Present. denies leaking of fluid. Review of Systems:   Pertinent items are noted in HPI Denies abnormal vaginal discharge w/ itching/odor/irritation, headaches, visual changes, shortness of breath, chest pain, abdominal pain, severe nausea/vomiting, or problems with urination or bowel movements unless otherwise stated above. Pertinent History Reviewed:  Reviewed past medical,surgical, social, obstetrical and family history.  Reviewed problem list, medications and allergies. Physical Assessment:   Vitals:   10/01/17 0922  BP: 90/60  Pulse: 98  Weight: 152 lb (68.9 kg)  Body mass index is 27.8 kg/m.        Physical Examination:   General appearance: Well appearing, and in no distress  Mental status: Alert, oriented to person, place, and time  Skin: Warm & dry  Cardiovascular: Normal heart rate noted  Respiratory: Normal respiratory effort, no distress  Abdomen: Soft, gravid, nontender  Pelvic: Cervical exam deferred         Extremities: Edema: None  Fetal Status: Fetal Heart Rate (bpm): 150 Fundal Height: 26 cm Movement: Present    Results for orders placed or performed in visit on 10/01/17 (from the past 24 hour(s))  POCT urinalysis dipstick   Collection Time: 10/01/17  9:29 AM  Result Value Ref Range   Color, UA     Clarity, UA     Glucose, UA neg    Bilirubin, UA     Ketones, UA neg    Spec Grav, UA  1.010 - 1.025   Blood, UA neg    pH, UA  5.0 - 8.0   Protein, UA trace    Urobilinogen, UA  0.2 or 1.0 E.U./dL   Nitrite, UA neg    Leukocytes, UA Negative Negative   Appearance     Odor      Assessment & Plan:  1) Low-risk pregnancy G2P0010 at 2659w2d with an Estimated Date of Delivery: 12/29/17   2) RUQ pain/gallbladder sludge, discussed and gave printed info on low-fat diet, discussed warning s/s to report, reasons to seek care   Meds: No orders of the defined types were placed in this encounter.  Labs/procedures today: pn1 and GTT  Plan:  Continue routine obstetrical care   Reviewed: Preterm labor symptoms and general obstetric precautions including but not limited to vaginal bleeding, contractions, leaking of fluid and fetal movement were reviewed in detail with the patient.  Recommended Tdap at HD/PCP per CDC guidelines. All questions were answered  Follow-up: Return in about 1 month (around 10/29/2017) for LROB.  Orders Placed This Encounter  Procedures  . POCT urinalysis dipstick   Cheral MarkerKimberly R Shalina Norfolk CNM, [facility]NP-BC 10/01/2017 10:35 AM

## 2017-10-09 LAB — CBC
HEMATOCRIT: 32 % — AB (ref 34.0–46.6)
Hemoglobin: 10.6 g/dL — ABNORMAL LOW (ref 11.1–15.9)
MCH: 32.2 pg (ref 26.6–33.0)
MCHC: 33.1 g/dL (ref 31.5–35.7)
MCV: 97 fL (ref 79–97)
Platelets: 354 10*3/uL (ref 150–379)
RBC: 3.29 x10E6/uL — AB (ref 3.77–5.28)
RDW: 13.1 % (ref 12.3–15.4)
WBC: 11.1 10*3/uL — ABNORMAL HIGH (ref 3.4–10.8)

## 2017-10-09 LAB — GLUCOSE TOLERANCE, 2 HOURS W/ 1HR
GLUCOSE, 1 HOUR: 95 mg/dL (ref 65–179)
GLUCOSE, 2 HOUR: 74 mg/dL (ref 65–152)
GLUCOSE, FASTING: 71 mg/dL (ref 65–91)

## 2017-10-09 LAB — CYSTIC FIBROSIS MUTATION 97: Interpretation: NOT DETECTED

## 2017-10-09 LAB — RPR: RPR Ser Ql: NONREACTIVE

## 2017-10-09 LAB — ABO/RH: Rh Factor: POSITIVE

## 2017-10-09 LAB — VARICELLA ZOSTER ANTIBODY, IGG: Varicella zoster IgG: <135 index — ABNORMAL LOW (ref >165)

## 2017-10-09 LAB — HEPATITIS B SURFACE ANTIGEN: Hepatitis B Surface Ag: NEGATIVE

## 2017-10-09 LAB — ANTIBODY SCREEN: Antibody Screen: NEGATIVE

## 2017-10-09 LAB — RUBELLA SCREEN: RUBELLA: 2.67 {index} (ref >0.99)

## 2017-10-09 LAB — HIV ANTIBODY (ROUTINE TESTING W REFLEX): HIV Screen 4th Generation wRfx: NONREACTIVE

## 2017-10-10 ENCOUNTER — Encounter: Payer: Self-pay | Admitting: Women's Health

## 2017-10-10 DIAGNOSIS — Z2839 Other underimmunization status: Secondary | ICD-10-CM | POA: Insufficient documentation

## 2017-10-10 DIAGNOSIS — Z283 Underimmunization status: Secondary | ICD-10-CM

## 2017-10-10 DIAGNOSIS — O09899 Supervision of other high risk pregnancies, unspecified trimester: Secondary | ICD-10-CM | POA: Insufficient documentation

## 2017-10-18 ENCOUNTER — Telehealth: Payer: Self-pay | Admitting: *Deleted

## 2017-10-18 NOTE — Telephone Encounter (Signed)
Spoke with pt. Pt has stuffy nose, cough, ears stopped up and headache. Advised to try Benadryl, drowsy precautions, also can try Claritin for daytime use. Run cool mist humidifier in bedroom when sleeping. If worse or no better after 7-10 days, call office back. Pt voiced understanding. JSY

## 2017-10-29 ENCOUNTER — Ambulatory Visit (INDEPENDENT_AMBULATORY_CARE_PROVIDER_SITE_OTHER): Payer: Medicaid Other | Admitting: Women's Health

## 2017-10-29 ENCOUNTER — Encounter: Payer: Self-pay | Admitting: Women's Health

## 2017-10-29 VITALS — BP 108/60 | HR 84 | Wt 158.0 lb

## 2017-10-29 DIAGNOSIS — Z331 Pregnant state, incidental: Secondary | ICD-10-CM

## 2017-10-29 DIAGNOSIS — Z1389 Encounter for screening for other disorder: Secondary | ICD-10-CM

## 2017-10-29 DIAGNOSIS — Z3483 Encounter for supervision of other normal pregnancy, third trimester: Secondary | ICD-10-CM

## 2017-10-29 DIAGNOSIS — Z3A31 31 weeks gestation of pregnancy: Secondary | ICD-10-CM

## 2017-10-29 LAB — POCT URINALYSIS DIPSTICK
Blood, UA: NEGATIVE
Glucose, UA: NEGATIVE
KETONES UA: NEGATIVE
Leukocytes, UA: NEGATIVE
NITRITE UA: NEGATIVE
PROTEIN UA: NEGATIVE

## 2017-10-29 NOTE — Progress Notes (Signed)
   LOW-RISK PREGNANCY VISIT Patient name: Patricia Vasquez MRN [id number]  Date of birth: April 03, 1996 Chief Complaint:   Routine Prenatal Visit  History of Present Illness:   Patricia Vasquez is a 22 y.o. G54P0010 female at [redacted]w[redacted]d with an Estimated Date of Delivery: 12/29/17 being seen today for ongoing management of a low-risk pregnancy.  Today she reports no complaints. Contractions: Not present. Vag. Bleeding: None.  Movement: Present. denies leaking of fluid. Review of Systems:   Pertinent items are noted in HPI Denies abnormal vaginal discharge w/ itching/odor/irritation, headaches, visual changes, shortness of breath, chest pain, abdominal pain, severe nausea/vomiting, or problems with urination or bowel movements unless otherwise stated above. Pertinent History Reviewed:  Reviewed past medical,surgical, social, obstetrical and family history.  Reviewed problem list, medications and allergies. Physical Assessment:   Vitals:   10/29/17 1408  BP: 108/60  Pulse: 84  Weight: 158 lb (71.7 kg)  Body mass index is 28.9 kg/m.        Physical Examination:   General appearance: Well appearing, and in no distress  Mental status: Alert, oriented to person, place, and time  Skin: Warm & dry  Cardiovascular: Normal heart rate noted  Respiratory: Normal respiratory effort, no distress  Abdomen: Soft, gravid, nontender  Pelvic: Cervical exam deferred         Extremities: Edema: None  Fetal Status: Fetal Heart Rate (bpm): 150 Fundal Height: 30 cm Movement: Present    Results for orders placed or performed in visit on 10/29/17 (from the past 24 hour(s))  POCT urinalysis dipstick   Collection Time: 10/29/17  2:09 PM  Result Value Ref Range   Color, UA     Clarity, UA     Glucose, UA neg    Bilirubin, UA     Ketones, UA neg    Spec Grav, UA  1.010 - 1.025   Blood, UA neg    pH, UA  5.0 - 8.0   Protein, UA neg    Urobilinogen, UA  0.2 or 1.0 E.U./dL   Nitrite, UA neg    Leukocytes, UA  Negative Negative   Appearance     Odor      Assessment & Plan:  1) Low-risk pregnancy G2P0010 at [redacted]w[redacted]d with an Estimated Date of Delivery: 12/29/17    Meds: No orders of the defined types were placed in this encounter.  Labs/procedures today: none  Plan:  Continue routine obstetrical care   Reviewed: Preterm labor symptoms and general obstetric precautions including but not limited to vaginal bleeding, contractions, leaking of fluid and fetal movement were reviewed in detail with the patient.  All questions were answered  Follow-up: Return in about 2 weeks (around 11/12/2017) for LROB.  Orders Placed This Encounter  Procedures  . POCT urinalysis dipstick   Cheral Marker CNM, [facility] 10/29/2017 2:36 PM

## 2017-10-29 NOTE — Patient Instructions (Signed)
[name], I greatly value your feedback.  If you receive a survey following your visit with [place] today, we appreciate you taking the time to fill it out.  Thanks, Joellyn Haff, CNM, WHNP-BC   Call the office [phone]) or go to [facility] if:  You begin to have strong, frequent contractions  Your water breaks.  Sometimes it is a big gush of fluid, sometimes it is just a trickle that keeps getting your panties wet or running down your legs  You have vaginal bleeding.  It is normal to have a small amount of spotting if your cervix was checked.   You don't feel your baby moving like normal.  If you don't, get you something to eat and drink and lay down and focus on feeling your baby move.  You should feel at least 10 movements in 2 hours.  If you don't, you should call the office or go to [facility].    Tdap Vaccine  It is recommended that you get the Tdap vaccine during the third trimester of EACH pregnancy to help protect your baby from getting pertussis (whooping cough)  27-36 weeks is the BEST time to do this so that you can pass the protection on to your baby. During pregnancy is better than after pregnancy, but if you are unable to get it during pregnancy it will be offered at the hospital.   You can get this vaccine at the health department or your family doctor  Everyone who will be around your baby should also be up-to-date on their vaccines. Adults (who are not pregnant) only need 1 dose of Tdap during adulthood.   Third Trimester of Pregnancy The third trimester is from week 29 through week 42, months 7 through 9. The third trimester is a time when the fetus is growing rapidly. At the end of the ninth month, the fetus is about 20 inches in length and weighs 6-10 pounds.  BODY CHANGES Your body goes through many changes during pregnancy. The changes vary from woman to woman.   Your weight will continue to increase. You can expect to gain 25-35 pounds (11-16 kg) by  the end of the pregnancy.  You may begin to get stretch marks on your hips, abdomen, and breasts.  You may urinate more often because the fetus is moving lower into your pelvis and pressing on your bladder.  You may develop or continue to have heartburn as a result of your pregnancy.  You may develop constipation because certain hormones are causing the muscles that push waste through your intestines to slow down.  You may develop hemorrhoids or swollen, bulging veins (varicose veins).  You may have pelvic pain because of the weight gain and pregnancy hormones relaxing your joints between the bones in your pelvis. Backaches may result from overexertion of the muscles supporting your posture.  You may have changes in your hair. These can include thickening of your hair, rapid growth, and changes in texture. Some women also have hair loss during or after pregnancy, or hair that feels dry or thin. Your hair will most likely return to normal after your baby is born.  Your breasts will continue to grow and be tender. A yellow discharge may leak from your breasts called colostrum.  Your belly button may stick out.  You may feel short of breath because of your expanding uterus.  You may notice the fetus "dropping," or moving lower in your abdomen.  You may have a bloody  mucus discharge. This usually occurs a few days to a week before labor begins.  Your cervix becomes thin and soft (effaced) near your due date. WHAT TO EXPECT AT YOUR PRENATAL EXAMS  You will have prenatal exams every 2 weeks until week 36. Then, you will have weekly prenatal exams. During a routine prenatal visit:  You will be weighed to make sure you and the fetus are growing normally.  Your blood pressure is taken.  Your abdomen will be measured to track your baby's growth.  The fetal heartbeat will be listened to.  Any test results from the previous visit will be discussed.  You may have a cervical check near your  due date to see if you have effaced. At around 36 weeks, your caregiver will check your cervix. At the same time, your caregiver will also perform a test on the secretions of the vaginal tissue. This test is to determine if a type of bacteria, Group B streptococcus, is present. Your caregiver will explain this further. Your caregiver may ask you:  What your birth plan is.  How you are feeling.  If you are feeling the baby move.  If you have had any abnormal symptoms, such as leaking fluid, bleeding, severe headaches, or abdominal cramping.  If you have any questions. Other tests or screenings that may be performed during your third trimester include:  Blood tests that check for low iron levels (anemia).  Fetal testing to check the health, activity level, and growth of the fetus. Testing is done if you have certain medical conditions or if there are problems during the pregnancy. FALSE LABOR You may feel small, irregular contractions that eventually go away. These are called Braxton Hicks contractions, or false labor. Contractions may last for hours, days, or even weeks before true labor sets in. If contractions come at regular intervals, intensify, or become painful, it is best to be seen by your caregiver.  SIGNS OF LABOR   Menstrual-like cramps.  Contractions that are 5 minutes apart or less.  Contractions that start on the top of the uterus and spread down to the lower abdomen and back.  A sense of increased pelvic pressure or back pain.  A watery or bloody mucus discharge that comes from the vagina. If you have any of these signs before the 37th week of pregnancy, call your caregiver right away. You need to go to the hospital to get checked immediately. HOME CARE INSTRUCTIONS   Avoid all smoking, herbs, alcohol, and unprescribed drugs. These chemicals affect the formation and growth of the baby.  Follow your caregiver's instructions regarding medicine use. There are medicines  that are either safe or unsafe to take during pregnancy.  Exercise only as directed by your caregiver. Experiencing uterine cramps is a good sign to stop exercising.  Continue to eat regular, healthy meals.  Wear a good support bra for breast tenderness.  Do not use hot tubs, steam rooms, or saunas.  Wear your seat belt at all times when driving.  Avoid raw meat, uncooked cheese, cat litter boxes, and soil used by cats. These carry germs that can cause birth defects in the baby.  Take your prenatal vitamins.  Try taking a stool softener (if your caregiver approves) if you develop constipation. Eat more high-fiber foods, such as fresh vegetables or fruit and whole grains. Drink plenty of fluids to keep your urine clear or pale yellow.  Take warm sitz baths to soothe any pain or discomfort caused by hemorrhoids.  Use hemorrhoid cream if your caregiver approves.  If you develop varicose veins, wear support hose. Elevate your feet for 15 minutes, 3-4 times a day. Limit salt in your diet.  Avoid heavy lifting, wear low heal shoes, and practice good posture.  Rest a lot with your legs elevated if you have leg cramps or low back pain.  Visit your dentist if you have not gone during your pregnancy. Use a soft toothbrush to brush your teeth and be gentle when you floss.  A sexual relationship may be continued unless your caregiver directs you otherwise.  Do not travel far distances unless it is absolutely necessary and only with the approval of your caregiver.  Take prenatal classes to understand, practice, and ask questions about the labor and delivery.  Make a trial run to the hospital.  Pack your hospital bag.  Prepare the baby's nursery.  Continue to go to all your prenatal visits as directed by your caregiver. SEEK MEDICAL CARE IF:  You are unsure if you are in labor or if your water has broken.  You have dizziness.  You have mild pelvic cramps, pelvic pressure, or nagging  pain in your abdominal area.  You have persistent nausea, vomiting, or diarrhea.  You have a bad smelling vaginal discharge.  You have pain with urination. SEEK IMMEDIATE MEDICAL CARE IF:   You have a fever.  You are leaking fluid from your vagina.  You have spotting or bleeding from your vagina.  You have severe abdominal cramping or pain.  You have rapid weight loss or gain.  You have shortness of breath with chest pain.  You notice sudden or extreme swelling of your face, hands, ankles, feet, or legs.  You have not felt your baby move in over an hour.  You have severe headaches that do not go away with medicine.  You have vision changes. Document Released: 06/06/2001 Document Revised: 06/17/2013 Document Reviewed: 08/13/2012 Exit[employer] Patient Information 2015 [employer]. This information is not intended to replace advice given to you by your health care provider. Make sure you discuss any questions you have with your health care provider.   

## 2017-11-12 ENCOUNTER — Ambulatory Visit (INDEPENDENT_AMBULATORY_CARE_PROVIDER_SITE_OTHER): Payer: Medicaid Other | Admitting: Obstetrics and Gynecology

## 2017-11-12 ENCOUNTER — Encounter: Payer: Self-pay | Admitting: Obstetrics and Gynecology

## 2017-11-12 VITALS — BP 102/60 | HR 124 | Wt 160.6 lb

## 2017-11-12 DIAGNOSIS — Z3483 Encounter for supervision of other normal pregnancy, third trimester: Secondary | ICD-10-CM

## 2017-11-12 DIAGNOSIS — Z1389 Encounter for screening for other disorder: Secondary | ICD-10-CM

## 2017-11-12 DIAGNOSIS — Z331 Pregnant state, incidental: Secondary | ICD-10-CM

## 2017-11-12 DIAGNOSIS — R102 Pelvic and perineal pain: Secondary | ICD-10-CM

## 2017-11-12 DIAGNOSIS — O9989 Other specified diseases and conditions complicating pregnancy, childbirth and the puerperium: Secondary | ICD-10-CM

## 2017-11-12 DIAGNOSIS — Z3A33 33 weeks gestation of pregnancy: Secondary | ICD-10-CM

## 2017-11-12 LAB — POCT URINALYSIS DIPSTICK
Blood, UA: NEGATIVE
Glucose, UA: NEGATIVE
KETONES UA: NEGATIVE
Leukocytes, UA: NEGATIVE
NITRITE UA: NEGATIVE
PROTEIN UA: NEGATIVE

## 2017-11-12 NOTE — Progress Notes (Signed)
Patient ID: Patricia Vasquez, female   DOB: [birth year], 21 y.o.   MRN: [id number]   LOW-RISK PREGNANCY VISIT Patient name: Patricia Vasquez MRN [id number]  Date of birth: 10/13/1995 Chief Complaint:   Routine Prenatal Visit (+ cramping)  History of Present Illness:   Patricia Vasquez is a 22 y.o. G57P0010 female at [redacted]w[redacted]d with an Estimated Date of Delivery: 12/29/17 being seen today for ongoing management of a low-risk pregnancy.  Today she reports cramping and pressure. The pressure feels as if something is pressing down low. She is accompanied by the baby's father. She is also accompanied by her mother and sister. She has not signed up for child birth classes. She also has not signed up for baby scripts.  Contractions: Irregular. Vag. Bleeding: None.  Movement: Present. denies leaking of fluid. Review of Systems:   Pertinent items are noted in HPI Denies abnormal vaginal discharge w/ itching/odor/irritation, headaches, visual changes, shortness of breath, chest pain, abdominal pain, severe nausea/vomiting, or problems with urination or bowel movements unless otherwise stated above. Pertinent History Reviewed:  Reviewed past medical,surgical, social, obstetrical and family history.  Reviewed problem list, medications and allergies. Physical Assessment:   Vitals:   11/12/17 1514  BP: 102/60  Pulse: (!) 124  Weight: 160 lb 9.6 oz (72.8 kg)  Body mass index is 29.37 kg/m.        Physical Examination:   General appearance: Well appearing, and in no distress  Mental status: Alert, oriented to person, place, and time  Skin: Warm & dry  Cardiovascular: Normal heart rate noted  Respiratory: Normal respiratory effort, no distress  Abdomen: Soft, gravid, nontender  Pelvic: Cervical exam deferred         Extremities: Edema: Trace  Fetal Status: Fetal Heart Rate (bpm): 140 Fundal Height: 33 cm Movement: Present    Results for orders placed or performed in visit on 11/12/17 (from the past 24  hour(s))  POCT urinalysis dipstick   Collection Time: 11/12/17  3:15 PM  Result Value Ref Range   Color, UA     Clarity, UA     Glucose, UA Negative Negative   Bilirubin, UA     Ketones, UA neg    Spec Grav, UA  1.010 - 1.025   Blood, UA neg    pH, UA  5.0 - 8.0   Protein, UA Negative Negative   Urobilinogen, UA  0.2 or 1.0 E.U./dL   Nitrite, UA neg    Leukocytes, UA Negative Negative   Appearance     Odor      Assessment & Plan:  1) Low-risk pregnancy G2P0010 at [redacted]w[redacted]d with an Estimated Date of Delivery: 12/29/17    Meds: No orders of the defined types were placed in this encounter.   Plan:  Continue routine obstetrical care   Follow-up: Return in about 2 weeks (around 11/26/2017) for LROB.  Orders Placed This Encounter  Procedures  . POCT urinalysis dipstick   By signing my name below, I, Diona Browner, attest that this documentation has been prepared under the direction and in the presence of Tilda Burrow, MD. Electronically Signed: Diona Browner, Medical Scribe. 11/12/17. 3:49 PM.  I personally performed the services described in this documentation, which was SCRIBED in my presence. The recorded information has been reviewed and considered accurate. It has been edited as necessary during review. Tilda Burrow, MD

## 2017-11-12 NOTE — Patient Instructions (Signed)
(  [phone] is the phone number for Pregnancy Classes or hospital tours at [facility].  ° °You will be referred to  [email or web address]   for more information on childbirth classes   °At this site you may register for classes. You may sign up for a waiting list if classes are full. Please SIGN UP FOR THIS!.   When the waiting list becomes long, sometimes new classes can be added. ° ° ° °

## 2017-11-26 ENCOUNTER — Ambulatory Visit (INDEPENDENT_AMBULATORY_CARE_PROVIDER_SITE_OTHER): Payer: Medicaid Other | Admitting: Obstetrics & Gynecology

## 2017-11-26 ENCOUNTER — Encounter: Payer: Self-pay | Admitting: Obstetrics & Gynecology

## 2017-11-26 VITALS — BP 100/80 | HR 109 | Wt 164.5 lb

## 2017-11-26 DIAGNOSIS — Z331 Pregnant state, incidental: Secondary | ICD-10-CM

## 2017-11-26 DIAGNOSIS — Z1389 Encounter for screening for other disorder: Secondary | ICD-10-CM

## 2017-11-26 DIAGNOSIS — Z3A35 35 weeks gestation of pregnancy: Secondary | ICD-10-CM

## 2017-11-26 DIAGNOSIS — Z3483 Encounter for supervision of other normal pregnancy, third trimester: Secondary | ICD-10-CM

## 2017-11-26 LAB — POCT URINALYSIS DIPSTICK
Blood, UA: NEGATIVE
Glucose, UA: NEGATIVE
KETONES UA: NEGATIVE
NITRITE UA: NEGATIVE
PROTEIN UA: NEGATIVE

## 2017-11-26 NOTE — Progress Notes (Signed)
G2P0010 7223w2d Estimated Date of Delivery: 12/29/17  Blood pressure 100/80, pulse (!) 109, weight 164 lb 8 oz (74.6 kg), last menstrual period 03/24/2017.   BP weight and urine results all reviewed and noted.  Please refer to the obstetrical flow sheet for the fundal height and fetal heart rate documentation:  Patient reports good fetal movement, denies any bleeding and no rupture of membranes symptoms or regular contractions. Patient is without complaints. All questions were answered.  Orders Placed This Encounter  Procedures  . POCT urinalysis dipstick    Plan:  Continued routine obstetrical care,   Return in about 10 days (around 12/06/2017) for LROB.

## 2017-12-06 ENCOUNTER — Encounter: Payer: Self-pay | Admitting: Advanced Practice Midwife

## 2017-12-06 ENCOUNTER — Ambulatory Visit (INDEPENDENT_AMBULATORY_CARE_PROVIDER_SITE_OTHER): Payer: Medicaid Other | Admitting: Advanced Practice Midwife

## 2017-12-06 VITALS — BP 101/61 | HR 102 | Wt 171.0 lb

## 2017-12-06 DIAGNOSIS — Z3483 Encounter for supervision of other normal pregnancy, third trimester: Secondary | ICD-10-CM

## 2017-12-06 DIAGNOSIS — Z331 Pregnant state, incidental: Secondary | ICD-10-CM

## 2017-12-06 DIAGNOSIS — Z3A36 36 weeks gestation of pregnancy: Secondary | ICD-10-CM

## 2017-12-06 DIAGNOSIS — Z1389 Encounter for screening for other disorder: Secondary | ICD-10-CM

## 2017-12-06 LAB — POCT URINALYSIS DIPSTICK
GLUCOSE UA: NEGATIVE
Ketones, UA: NEGATIVE
Leukocytes, UA: NEGATIVE
Nitrite, UA: NEGATIVE
Protein, UA: POSITIVE — AB
RBC UA: NEGATIVE

## 2017-12-06 LAB — OB RESULTS CONSOLE GBS: GBS: NEGATIVE

## 2017-12-06 NOTE — Progress Notes (Signed)
  G2P0010 3515w5d Estimated Date of Delivery: 12/29/17  Blood pressure 101/61, pulse (!) 102, weight 171 lb (77.6 kg), last menstrual period 03/24/2017.   BP weight and urine results all reviewed and noted.  Please refer to the obstetrical flow sheet for the fundal height and fetal heart rate documentation:  Patient reports good fetal movement, denies any bleeding and no rupture of membranes symptoms or regular contractions. Patient is without complaints. All questions were answered.   Physical Assessment:   Vitals:   12/06/17 1438  BP: 101/61  Pulse: (!) 102  Weight: 171 lb (77.6 kg)  Body mass index is 31.28 kg/m.        Physical Examination:   General appearance: Well appearing, and in no distress  Mental status: Alert, oriented to person, place, and time  Skin: Warm & dry  Cardiovascular: Normal heart rate noted  Respiratory: Normal respiratory effort, no distress  Abdomen: Soft, gravid, nontender  Pelvic: Cervical exam deferred per request         Extremities: Edema: None  Fetal Status:     Movement: Present    Results for orders placed or performed in visit on 12/06/17 (from the past 24 hour(s))  POCT urinalysis dipstick   Collection Time: 12/06/17  2:46 PM  Result Value Ref Range   Color, UA     Clarity, UA     Glucose, UA Negative Negative   Bilirubin, UA     Ketones, UA neg    Spec Grav, UA  1.010 - 1.025   Blood, UA neg    pH, UA  5.0 - 8.0   Protein, UA Positive (A) Negative   Urobilinogen, UA  0.2 or 1.0 E.U./dL   Nitrite, UA neg    Leukocytes, UA Negative Negative   Appearance     Odor       Orders Placed This Encounter  Procedures  . Strep Gp B NAA  . GC/Chlamydia Probe Amp  . POCT urinalysis dipstick    Plan:  Continued routine obstetrical care,   Return in about 1 week (around 12/13/2017) for LROB.

## 2017-12-08 LAB — STREP GP B NAA: Strep Gp B NAA: NEGATIVE

## 2017-12-10 LAB — GC/CHLAMYDIA PROBE AMP
CHLAMYDIA, DNA PROBE: NEGATIVE
NEISSERIA GONORRHOEAE BY PCR: NEGATIVE

## 2017-12-13 ENCOUNTER — Encounter: Payer: Self-pay | Admitting: Obstetrics and Gynecology

## 2017-12-13 ENCOUNTER — Ambulatory Visit (INDEPENDENT_AMBULATORY_CARE_PROVIDER_SITE_OTHER): Payer: Medicaid Other | Admitting: Obstetrics and Gynecology

## 2017-12-13 VITALS — BP 101/66 | HR 112 | Wt 169.6 lb

## 2017-12-13 DIAGNOSIS — Z3483 Encounter for supervision of other normal pregnancy, third trimester: Secondary | ICD-10-CM

## 2017-12-13 DIAGNOSIS — Z331 Pregnant state, incidental: Secondary | ICD-10-CM

## 2017-12-13 DIAGNOSIS — F1990 Other psychoactive substance use, unspecified, uncomplicated: Secondary | ICD-10-CM

## 2017-12-13 DIAGNOSIS — Z1389 Encounter for screening for other disorder: Secondary | ICD-10-CM

## 2017-12-13 DIAGNOSIS — Z3A37 37 weeks gestation of pregnancy: Secondary | ICD-10-CM

## 2017-12-13 LAB — POCT URINALYSIS DIPSTICK
Blood, UA: NEGATIVE
Glucose, UA: NEGATIVE
Ketones, UA: NEGATIVE
LEUKOCYTES UA: NEGATIVE
NITRITE UA: NEGATIVE
PROTEIN UA: NEGATIVE

## 2017-12-13 NOTE — Progress Notes (Signed)
Patient ID: Patricia Vasquez, female   DOB: 02/15/1996, 22 y.o.   MRN: [id number]    LOW-RISK PREGNANCY VISIT Patient name: Patricia Vasquez MRN [id number]  Date of birth: 04/24/1996 Chief Complaint:   Routine Prenatal Visit (feet swelling)  History of Present Illness:   Patricia Vasquez is a 22 y.o. 122P0010 female at 4673w5d with an Estimated Date of Delivery: 12/29/17 being seen today for ongoing management of a low-risk pregnancy.  Today she reports no complaints. Her baby is staying active. She has birth control plans worked out after this child.   Contractions: Not present. Vag. Bleeding: None.  Movement: Present. denies leaking of fluid. Review of Systems:   Pertinent items are noted in HPI Denies abnormal vaginal discharge w/ itching/odor/irritation, headaches, visual changes, shortness of breath, chest pain, abdominal pain, severe nausea/vomiting, or problems with urination or bowel movements unless otherwise stated above. Pertinent History Reviewed:  Reviewed past medical,surgical, social, obstetrical and family history.  Reviewed problem list, medications and allergies. Physical Assessment:   Vitals:   12/13/17 1502  BP: 101/66  Pulse: (!) 112  Weight: 169 lb 9.6 oz (76.9 kg)  Body mass index is 31.02 kg/m.        Physical Examination:   General appearance: Well appearing, and in no distress  Mental status: Alert, oriented to person, place, and time  Skin: Warm & dry  Cardiovascular: Normal heart rate noted  Respiratory: Normal respiratory effort, no distress  Abdomen: Soft, gravid, nontender  Pelvic: Cervical exam deferred         Extremities: Edema: Trace  Fetal Status:     Movement: Present    Results for orders placed or performed in visit on 12/13/17 (from the past 24 hour(s))  POCT urinalysis dipstick   Collection Time: 12/13/17  3:03 PM  Result Value Ref Range   Color, UA     Clarity, UA     Glucose, UA Negative Negative   Bilirubin, UA     Ketones, UA neg     Spec Grav, UA  1.010 - 1.025   Blood, UA neg    pH, UA  5.0 - 8.0   Protein, UA Negative Negative   Urobilinogen, UA  0.2 or 1.0 E.U./dL   Nitrite, UA neg    Leukocytes, UA Negative Negative   Appearance     Odor      Assessment & Plan:  1) Low-risk pregnancy G2P0010 at 7473w5d with an Estimated Date of Delivery: 12/29/17    Meds: No orders of the defined types were placed in this encounter.   Plan:  Continue routine obstetrical care  Reviewed: Term labor symptoms and general obstetric precautions including but not limited to vaginal bleeding, contractions, leaking of fluid and fetal movement were reviewed in detail with the patient.  All questions were answered  Follow-up: Return in about 1 week (around 12/20/2017) for LROB.  Orders Placed This Encounter  Procedures  . POCT urinalysis dipstick   By signing my name below, I, Pietro CassisEmily Tufford, attest that this documentation has been prepared under the direction and in the presence of Tilda BurrowFerguson, Saniyyah Elster V, MD. Electronically Signed: Pietro CassisEmily Tufford, Medical Scribe. 12/13/17. 3:08 PM.  I personally performed the services described in this documentation, which was SCRIBED in my presence. The recorded information has been reviewed and considered accurate. It has been edited as necessary during review. Tilda BurrowJohn V Jaycee Mckellips, MD

## 2017-12-13 NOTE — Patient Instructions (Signed)
Third Trimester of Pregnancy The third trimester is from week 28 through week 40 (months 7 through 9). The third trimester is a time when the unborn baby (fetus) is growing rapidly. At the end of the ninth month, the fetus is about 20 inches in length and weighs 6-10 pounds. Body changes during your third trimester Your body will continue to go through many changes during pregnancy. The changes vary from woman to woman. During the third trimester:  Your weight will continue to increase. You can expect to gain 25-35 pounds (11-16 kg) by the end of the pregnancy.  You may begin to get stretch marks on your hips, abdomen, and breasts.  You may urinate more often because the fetus is moving lower into your pelvis and pressing on your bladder.  You may develop or continue to have heartburn. This is caused by increased hormones that slow down muscles in the digestive tract.  You may develop or continue to have constipation because increased hormones slow digestion and cause the muscles that push waste through your intestines to relax.  You may develop hemorrhoids. These are swollen veins (varicose veins) in the rectum that can itch or be painful.  You may develop swollen, bulging veins (varicose veins) in your legs.  You may have increased body aches in the pelvis, back, or thighs. This is due to weight gain and increased hormones that are relaxing your joints.  You may have changes in your hair. These can include thickening of your hair, rapid growth, and changes in texture. Some women also have hair loss during or after pregnancy, or hair that feels dry or thin. Your hair will most likely return to normal after your baby is born.  Your breasts will continue to grow and they will continue to become tender. A yellow fluid (colostrum) may leak from your breasts. This is the first milk you are producing for your baby.  Your belly button may stick out.  You may notice more swelling in your hands,  face, or ankles.  You may have increased tingling or numbness in your hands, arms, and legs. The skin on your belly may also feel numb.  You may feel short of breath because of your expanding uterus.  You may have more problems sleeping. This can be caused by the size of your belly, increased need to urinate, and an increase in your body's metabolism.  You may notice the fetus "dropping," or moving lower in your abdomen (lightening).  You may have increased vaginal discharge.  You may notice your joints feel loose and you may have pain around your pelvic bone.  What to expect at prenatal visits You will have prenatal exams every 2 weeks until week 36. Then you will have weekly prenatal exams. During a routine prenatal visit:  You will be weighed to make sure you and the baby are growing normally.  Your blood pressure will be taken.  Your abdomen will be measured to track your baby's growth.  The fetal heartbeat will be listened to.  Any test results from the previous visit will be discussed.  You may have a cervical check near your due date to see if your cervix has softened or thinned (effaced).  You will be tested for Group B streptococcus. This happens between 35 and 37 weeks.  Your health care provider may ask you:  What your birth plan is.  How you are feeling.  If you are feeling the baby move.  If you have had   any abnormal symptoms, such as leaking fluid, bleeding, severe headaches, or abdominal cramping.  If you are using any tobacco products, including cigarettes, chewing tobacco, and electronic cigarettes.  If you have any questions.  Other tests or screenings that may be performed during your third trimester include:  Blood tests that check for low iron levels (anemia).  Fetal testing to check the health, activity level, and growth of the fetus. Testing is done if you have certain medical conditions or if there are problems during the  pregnancy.  Nonstress test (NST). This test checks the health of your baby to make sure there are no signs of problems, such as the baby not getting enough oxygen. During this test, a belt is placed around your belly. The baby is made to move, and its heart rate is monitored during movement.  What is false labor? False labor is a condition in which you feel small, irregular tightenings of the muscles in the womb (contractions) that usually go away with rest, changing position, or drinking water. These are called Braxton Hicks contractions. Contractions may last for hours, days, or even weeks before true labor sets in. If contractions come at regular intervals, become more frequent, increase in intensity, or become painful, you should see your health care provider. What are the signs of labor?  Abdominal cramps.  Regular contractions that start at 10 minutes apart and become stronger and more frequent with time.  Contractions that start on the top of the uterus and spread down to the lower abdomen and back.  Increased pelvic pressure and dull back pain.  A watery or bloody mucus discharge that comes from the vagina.  Leaking of amniotic fluid. This is also known as your "water breaking." It could be a slow trickle or a gush. Let your health care provider know if it has a color or strange odor. If you have any of these signs, call your health care provider right away, even if it is before your due date. Follow these instructions at home: Medicines  Follow your health care provider's instructions regarding medicine use. Specific medicines may be either safe or unsafe to take during pregnancy.  Take a prenatal vitamin that contains at least 600 micrograms (mcg) of folic acid.  If you develop constipation, try taking a stool softener if your health care provider approves. Eating and drinking  Eat a balanced diet that includes fresh fruits and vegetables, whole grains, good sources of protein  such as meat, eggs, or tofu, and low-fat dairy. Your health care provider will help you determine the amount of weight gain that is right for you.  Avoid raw meat and uncooked cheese. These carry germs that can cause birth defects in the baby.  If you have low calcium intake from food, talk to your health care provider about whether you should take a daily calcium supplement.  Eat four or five small meals rather than three large meals a day.  Limit foods that are high in fat and processed sugars, such as fried and sweet foods.  To prevent constipation: ? Drink enough fluid to keep your urine clear or pale yellow. ? Eat foods that are high in fiber, such as fresh fruits and vegetables, whole grains, and beans. Activity  Exercise only as directed by your health care provider. Most women can continue their usual exercise routine during pregnancy. Try to exercise for 30 minutes at least 5 days a week. Stop exercising if you experience uterine contractions.  Avoid heavy   lifting.  Do not exercise in extreme heat or humidity, or at high altitudes.  Wear low-heel, comfortable shoes.  Practice good posture.  You may continue to have sex unless your health care provider tells you otherwise. Relieving pain and discomfort  Take frequent breaks and rest with your legs elevated if you have leg cramps or low back pain.  Take warm sitz baths to soothe any pain or discomfort caused by hemorrhoids. Use hemorrhoid cream if your health care provider approves.  Wear a good support bra to prevent discomfort from breast tenderness.  If you develop varicose veins: ? Wear support pantyhose or compression stockings as told by your healthcare provider. ? Elevate your feet for 15 minutes, 3-4 times a day. Prenatal care  Write down your questions. Take them to your prenatal visits.  Keep all your prenatal visits as told by your health care provider. This is important. Safety  Wear your seat belt at  all times when driving.  Make a list of emergency phone numbers, including numbers for family, friends, the hospital, and police and fire departments. General instructions  Avoid cat litter boxes and soil used by cats. These carry germs that can cause birth defects in the baby. If you have a cat, ask someone to clean the litter box for you.  Do not travel far distances unless it is absolutely necessary and only with the approval of your health care provider.  Do not use hot tubs, steam rooms, or saunas.  Do not drink alcohol.  Do not use any products that contain nicotine or tobacco, such as cigarettes and e-cigarettes. If you need help quitting, ask your health care provider.  Do not use any medicinal herbs or unprescribed drugs. These chemicals affect the formation and growth of the baby.  Do not douche or use tampons or scented sanitary pads.  Do not cross your legs for long periods of time.  To prepare for the arrival of your baby: ? Take prenatal classes to understand, practice, and ask questions about labor and delivery. ? Make a trial run to the hospital. ? Visit the hospital and tour the maternity area. ? Arrange for maternity or paternity leave through employers. ? Arrange for family and friends to take care of pets while you are in the hospital. ? Purchase a rear-facing car seat and make sure you know how to install it in your car. ? Pack your hospital bag. ? Prepare the baby's nursery. Make sure to remove all pillows and stuffed animals from the baby's crib to prevent suffocation.  Visit your dentist if you have not gone during your pregnancy. Use a soft toothbrush to brush your teeth and be gentle when you floss. Contact a health care provider if:  You are unsure if you are in labor or if your water has broken.  You become dizzy.  You have mild pelvic cramps, pelvic pressure, or nagging pain in your abdominal area.  You have lower back pain.  You have persistent  nausea, vomiting, or diarrhea.  You have an unusual or bad smelling vaginal discharge.  You have pain when you urinate. Get help right away if:  Your water breaks before 37 weeks.  You have regular contractions less than 5 minutes apart before 37 weeks.  You have a fever.  You are leaking fluid from your vagina.  You have spotting or bleeding from your vagina.  You have severe abdominal pain or cramping.  You have rapid weight loss or weight gain.    You have shortness of breath with chest pain.  You notice sudden or extreme swelling of your face, hands, ankles, feet, or legs.  Your baby makes fewer than 10 movements in 2 hours.  You have severe headaches that do not go away when you take medicine.  You have vision changes. Summary  The third trimester is from week 28 through week 40, months 7 through 9. The third trimester is a time when the unborn baby (fetus) is growing rapidly.  During the third trimester, your discomfort may increase as you and your baby continue to gain weight. You may have abdominal, leg, and back pain, sleeping problems, and an increased need to urinate.  During the third trimester your breasts will keep growing and they will continue to become tender. A yellow fluid (colostrum) may leak from your breasts. This is the first milk you are producing for your baby.  False labor is a condition in which you feel small, irregular tightenings of the muscles in the womb (contractions) that eventually go away. These are called Braxton Hicks contractions. Contractions may last for hours, days, or even weeks before true labor sets in.  Signs of labor can include: abdominal cramps; regular contractions that start at 10 minutes apart and become stronger and more frequent with time; watery or bloody mucus discharge that comes from the vagina; increased pelvic pressure and dull back pain; and leaking of amniotic fluid. This information is not intended to replace advice  given to you by your health care provider. Make sure you discuss any questions you have with your health care provider. Document Released: 06/06/2001 Document Revised: 11/18/2015 Document Reviewed: 08/13/2012 Elsevier Interactive Patient Education  2017 [employer]  

## 2017-12-19 ENCOUNTER — Encounter: Payer: Self-pay | Admitting: Obstetrics and Gynecology

## 2017-12-19 ENCOUNTER — Ambulatory Visit (INDEPENDENT_AMBULATORY_CARE_PROVIDER_SITE_OTHER): Payer: Medicaid Other | Admitting: Obstetrics and Gynecology

## 2017-12-19 VITALS — BP 102/62 | HR 104 | Wt 172.6 lb

## 2017-12-19 DIAGNOSIS — K828 Other specified diseases of gallbladder: Secondary | ICD-10-CM

## 2017-12-19 DIAGNOSIS — Z1389 Encounter for screening for other disorder: Secondary | ICD-10-CM

## 2017-12-19 DIAGNOSIS — Z331 Pregnant state, incidental: Secondary | ICD-10-CM

## 2017-12-19 DIAGNOSIS — Z3A38 38 weeks gestation of pregnancy: Secondary | ICD-10-CM

## 2017-12-19 DIAGNOSIS — Z3483 Encounter for supervision of other normal pregnancy, third trimester: Secondary | ICD-10-CM

## 2017-12-19 LAB — POCT URINALYSIS DIPSTICK
Blood, UA: NEGATIVE
GLUCOSE UA: NEGATIVE
KETONES UA: NEGATIVE
Nitrite, UA: NEGATIVE
Protein, UA: POSITIVE — AB

## 2017-12-19 NOTE — Progress Notes (Signed)
Patient ID: Patricia Vasquez, female   DOB: 12/04/1995, 22 y.o.   MRN: [id number]   LOW-RISK PREGNANCY VISIT Patient name: Patricia HaberKayleigh N Domanski MRN [id number]  Date of birth: 07/05/1995 Chief Complaint:   low risk ob  History of Present Illness:   Patricia HaberKayleigh N Sova is a 22 y.o. 912P0010 female at 4930w4d with an Estimated Date of Delivery: 12/29/17 being seen today for ongoing management of a low-risk pregnancy.  Today she reports feeling pressure last night, 12/18/2017. Contractions: Not present.  .  Movement: Present. denies leaking of fluid. Review of Systems:   Pertinent items are noted in HPI Denies abnormal vaginal discharge w/ itching/odor/irritation, headaches, visual changes, shortness of breath, chest pain, abdominal pain, severe nausea/vomiting, or problems with urination or bowel movements unless otherwise stated above. Pertinent History Reviewed:  Reviewed past medical,surgical, social, obstetrical and family history.  Reviewed problem list, medications and allergies. Physical Assessment:   Vitals:   12/19/17 1401  BP: 102/62  Pulse: (!) 104  Weight: 172 lb 9.6 oz (78.3 kg)  Body mass index is 31.57 kg/m.        Physical Examination:   General appearance: Well appearing, and in no distress  Mental status: Alert, oriented to person, place, and time  Skin: Warm & dry  Cardiovascular: Normal heart rate noted  Respiratory: Normal respiratory effort, no distress  Abdomen: Soft, gravid, nontender  Pelvic: Cervical exam performed  Dilation: 1 Effacement (%): 50   cervix is posterior  Extremities: Edema: Trace  Fetal Status: Fetal Heart Rate (bpm): 143 Fundal Height: 37 cm Movement: Present    Results for orders placed or performed in visit on 12/19/17 (from the past 24 hour(s))  POCT urinalysis dipstick   Collection Time: 12/19/17  2:05 PM  Result Value Ref Range   Color, UA     Clarity, UA     Glucose, UA Negative Negative   Bilirubin, UA     Ketones, UA neg    Spec Grav, UA   1.010 - 1.025   Blood, UA neg    pH, UA  5.0 - 8.0   Protein, UA Positive (A) Negative   Urobilinogen, UA  0.2 or 1.0 E.U./dL   Nitrite, UA neg    Leukocytes, UA Small (1+) (A) Negative   Appearance     Odor      Assessment & Plan:  1) Low-risk pregnancy G2P0010 at 3830w4d with an Estimated Date of Delivery: 12/29/17    Meds: No orders of the defined types were placed in this encounter.  Labs/procedures today: none  Plan:  Continue routine obstetrical care   Follow-up: Return in about 1 week (around 12/26/2017).  Orders Placed This Encounter  Procedures  . POCT urinalysis dipstick   By signing my name below, I, Diona BrownerJennifer Gorman, attest that this documentation has been prepared under the direction and in the presence of Tilda BurrowFerguson, Pegge Cumberledge V, MD. Electronically Signed: Diona BrownerJennifer Gorman, Medical Scribe. 12/19/17. 2:25 PM.  I personally performed the services described in this documentation, which was SCRIBED in my presence. The recorded information has been reviewed and considered accurate. It has been edited as necessary during review. Tilda BurrowJohn V Zaeda Mcferran, MD

## 2017-12-26 ENCOUNTER — Encounter: Payer: Self-pay | Admitting: Advanced Practice Midwife

## 2017-12-26 ENCOUNTER — Ambulatory Visit (INDEPENDENT_AMBULATORY_CARE_PROVIDER_SITE_OTHER): Payer: Medicaid Other | Admitting: Advanced Practice Midwife

## 2017-12-26 VITALS — BP 114/63 | HR 99 | Wt 176.5 lb

## 2017-12-26 DIAGNOSIS — O480 Post-term pregnancy: Secondary | ICD-10-CM

## 2017-12-26 DIAGNOSIS — Z3483 Encounter for supervision of other normal pregnancy, third trimester: Secondary | ICD-10-CM

## 2017-12-26 DIAGNOSIS — Z1389 Encounter for screening for other disorder: Secondary | ICD-10-CM

## 2017-12-26 DIAGNOSIS — Z3A39 39 weeks gestation of pregnancy: Secondary | ICD-10-CM

## 2017-12-26 DIAGNOSIS — Z331 Pregnant state, incidental: Secondary | ICD-10-CM

## 2017-12-26 LAB — POCT URINALYSIS DIPSTICK
Glucose, UA: NEGATIVE
Ketones, UA: NEGATIVE
Nitrite, UA: NEGATIVE
PROTEIN UA: NEGATIVE
RBC UA: NEGATIVE

## 2017-12-26 NOTE — Progress Notes (Signed)
   LOW-RISK PREGNANCY VISIT Patient name: Patricia Vasquez MRN [id number]  Date of birth: 11/30/1995 Chief Complaint:   Routine Prenatal Visit  History of Present Illness:   Patricia Vasquez is a 22 y.o. 172P0010 female at 2356w4d with an Estimated Date of Delivery: 12/29/17 being seen today for ongoing management of a low-risk pregnancy.  Today she reports no complaints. Contractions: Irregular. Vag. Bleeding: None.  Movement: Present. denies leaking of fluid. Review of Systems:   Pertinent items are noted in HPI Denies abnormal vaginal discharge w/ itching/odor/irritation, headaches, visual changes, shortness of breath, chest pain, abdominal pain, severe nausea/vomiting, or problems with urination or bowel movements unless otherwise stated above.  Pertinent History Reviewed:  Medical & Surgical Hx:   Past Medical History:  Diagnosis Date  . Medical history non-contributory    Past Surgical History:  Procedure Laterality Date  . ADENOIDECTOMY    . TONSILLECTOMY     Family History  Problem Relation Age of Onset  . Hypertension Father   . Cancer Maternal Grandfather        luekemia  . Cancer Paternal Grandfather        lung    Current Outpatient Medications:  .  Prenat-FeCbn-FeAspGl-FA-Omega (OB COMPLETE PETITE) 35-5-1-200 MG CAPS, Take 1 daily, Disp: 30 capsule, Rfl: 12 Social History: Reviewed -  reports that she has quit smoking. Her smoking use included cigarettes. She smoked 0.25 packs per day. She has never used smokeless tobacco.  Physical Assessment:   Vitals:   12/26/17 1402  BP: 114/63  Pulse: 99  Weight: 176 lb 8 oz (80.1 kg)  Body mass index is 32.28 kg/m.        Physical Examination:   General appearance: Well appearing, and in no distress  Mental status: Alert, oriented to person, place, and time  Skin: Warm & dry  Cardiovascular: Normal heart rate noted  Respiratory: Normal respiratory effort, no distress  Abdomen: Soft, gravid, nontender  Pelvic:  Cervical exam deferred         Extremities: Edema: None  Fetal Status:     Movement: Present    Results for orders placed or performed in visit on 12/26/17 (from the past 24 hour(s))  POCT urinalysis dipstick   Collection Time: 12/26/17  2:03 PM  Result Value Ref Range   Color, UA     Clarity, UA     Glucose, UA Negative Negative   Bilirubin, UA     Ketones, UA neg    Spec Grav, UA  1.010 - 1.025   Blood, UA neg    pH, UA  5.0 - 8.0   Protein, UA Negative Negative   Urobilinogen, UA  0.2 or 1.0 E.U./dL   Nitrite, UA neg    Leukocytes, UA Moderate (2+) (A) Negative   Appearance     Odor      Assessment & Plan:  1) Low-risk pregnancy G2P0010 at 2656w4d with an Estimated Date of Delivery: 12/29/17   2) ,    Labs/procedures/US today:   Plan:  Continue routine obstetrical care    Follow-up: Return in about 1 week (around 01/02/2018) for LROB, US:BPP.  Orders Placed This Encounter  Procedures  . US FETAL BPP WO NON STRESS  . POCT urinalysis dipstick   Jacklyn ShellFrances Cresenzo-Dishmon CNM 12/26/2017 2:18 PM

## 2017-12-26 NOTE — Patient Instructions (Signed)

## 2017-12-28 ENCOUNTER — Other Ambulatory Visit: Payer: Self-pay | Admitting: *Deleted

## 2017-12-28 ENCOUNTER — Other Ambulatory Visit: Payer: Medicaid Other

## 2017-12-31 ENCOUNTER — Telehealth: Payer: Self-pay | Admitting: Women's Health

## 2017-12-31 NOTE — Telephone Encounter (Signed)
Pt is 40 weeks preg and needs to talk to nurse about leaking fluid

## 2018-01-03 ENCOUNTER — Encounter: Payer: Self-pay | Admitting: Women's Health

## 2018-01-03 ENCOUNTER — Encounter (HOSPITAL_COMMUNITY): Payer: Self-pay

## 2018-01-03 ENCOUNTER — Inpatient Hospital Stay (HOSPITAL_COMMUNITY)
Admission: AD | Admit: 2018-01-03 | Discharge: 2018-01-06 | DRG: 806 | Disposition: A | Discharge status: Home / Self Care | Payer: Medicaid Other | Source: Ambulatory Visit | Attending: Obstetrics & Gynecology | Admitting: Obstetrics & Gynecology

## 2018-01-03 ENCOUNTER — Ambulatory Visit (INDEPENDENT_AMBULATORY_CARE_PROVIDER_SITE_OTHER): Payer: Medicaid Other | Admitting: Women's Health

## 2018-01-03 VITALS — BP 103/68 | HR 109 | Wt 176.6 lb

## 2018-01-03 DIAGNOSIS — F1390 Sedative, hypnotic, or anxiolytic use, unspecified, uncomplicated: Secondary | ICD-10-CM | POA: Diagnosis present

## 2018-01-03 DIAGNOSIS — Z3483 Encounter for supervision of other normal pregnancy, third trimester: Secondary | ICD-10-CM

## 2018-01-03 DIAGNOSIS — F1290 Cannabis use, unspecified, uncomplicated: Secondary | ICD-10-CM | POA: Diagnosis present

## 2018-01-03 DIAGNOSIS — O480 Post-term pregnancy: Secondary | ICD-10-CM | POA: Diagnosis present

## 2018-01-03 DIAGNOSIS — O99324 Drug use complicating childbirth: Secondary | ICD-10-CM | POA: Diagnosis present

## 2018-01-03 DIAGNOSIS — Z3A40 40 weeks gestation of pregnancy: Secondary | ICD-10-CM

## 2018-01-03 DIAGNOSIS — Z1389 Encounter for screening for other disorder: Secondary | ICD-10-CM

## 2018-01-03 DIAGNOSIS — Z3403 Encounter for supervision of normal first pregnancy, third trimester: Secondary | ICD-10-CM

## 2018-01-03 DIAGNOSIS — Z331 Pregnant state, incidental: Secondary | ICD-10-CM

## 2018-01-03 LAB — POCT URINALYSIS DIPSTICK
Glucose, UA: NEGATIVE
Ketones, UA: NEGATIVE
LEUKOCYTES UA: NEGATIVE
NITRITE UA: NEGATIVE
PROTEIN UA: NEGATIVE
RBC UA: NEGATIVE

## 2018-01-03 LAB — CBC
HCT: 33.8 % — ABNORMAL LOW (ref 36.0–46.0)
Hemoglobin: 11.1 g/dL — ABNORMAL LOW (ref 12.0–15.0)
MCH: 30.6 pg (ref 26.0–34.0)
MCHC: 32.8 g/dL (ref 30.0–36.0)
MCV: 93.1 fL (ref 78.0–100.0)
PLATELETS: 411 10*3/uL — AB (ref 150–400)
RBC: 3.63 MIL/uL — ABNORMAL LOW (ref 3.87–5.11)
RDW: 15.3 % (ref 11.5–15.5)
WBC: 19.3 10*3/uL — ABNORMAL HIGH (ref 4.0–10.5)

## 2018-01-03 LAB — TYPE AND SCREEN
ABO/RH(D): B POS
Antibody Screen: NEGATIVE

## 2018-01-03 MED ORDER — OXYTOCIN BOLUS FROM INFUSION: 500.0000 mL | Freq: Once | INTRAVENOUS | Status: DC

## 2018-01-03 MED ORDER — SOD CITRATE-CITRIC ACID 500-334 MG/5ML PO SOLN: 30.0000 mL | ORAL | Status: DC | PRN

## 2018-01-03 MED ORDER — ACETAMINOPHEN 325 MG PO TABS: 650.0000 mg | ORAL_TABLET | ORAL | Status: DC | PRN

## 2018-01-03 MED ORDER — OXYTOCIN 40 UNITS IN LACTATED RINGERS INFUSION - SIMPLE MED: 2.5000 [IU]/h | INTRAVENOUS | Status: DC

## 2018-01-03 MED ORDER — EPHEDRINE 5 MG/ML INJ
10.0000 mg | INTRAVENOUS | Status: DC | PRN
  Filled 2018-01-03: qty 2

## 2018-01-03 MED ORDER — FENTANYL 2.5 MCG/ML BUPIVACAINE 1/10 % EPIDURAL INFUSION (WH - ANES)
14.0000 mL/h | INTRAMUSCULAR | Status: DC | PRN
  Administered 2018-01-04 (×3): 14 mL/h via EPIDURAL
  Filled 2018-01-03 (×3): qty 100

## 2018-01-03 MED ORDER — HYDROXYZINE HCL 50 MG PO TABS
50.0000 mg | ORAL_TABLET | Freq: Four times a day (QID) | ORAL | Status: DC | PRN
  Filled 2018-01-03: qty 1

## 2018-01-03 MED ORDER — LACTATED RINGERS IV SOLN
500.0000 mL | Freq: Once | INTRAVENOUS | Status: AC
  Administered 2018-01-03: 500 mL via INTRAVENOUS

## 2018-01-03 MED ORDER — LACTATED RINGERS IV SOLN: INTRAVENOUS | Status: DC

## 2018-01-03 MED ORDER — LIDOCAINE HCL (PF) 1 % IJ SOLN
30.0000 mL | INTRAMUSCULAR | Status: DC | PRN
  Filled 2018-01-03: qty 30

## 2018-01-03 MED ORDER — LIDOCAINE HCL (PF) 1 % IJ SOLN: 30.0000 mL | INTRAMUSCULAR | Status: DC | PRN

## 2018-01-03 MED ORDER — FENTANYL CITRATE (PF) 100 MCG/2ML IJ SOLN
100.0000 ug | INTRAMUSCULAR | Status: DC | PRN
  Administered 2018-01-03: 100 ug via INTRAVENOUS
  Filled 2018-01-03: qty 2

## 2018-01-03 MED ORDER — LACTATED RINGERS IV SOLN
500.0000 mL | INTRAVENOUS | Status: DC | PRN
  Administered 2018-01-04: 1000 mL via INTRAVENOUS

## 2018-01-03 MED ORDER — TERBUTALINE SULFATE 1 MG/ML IJ SOLN
0.2500 mg | Freq: Once | INTRAMUSCULAR | Status: DC | PRN
  Filled 2018-01-03: qty 1

## 2018-01-03 MED ORDER — LACTATED RINGERS IV SOLN
INTRAVENOUS | Status: DC
Start: 2018-01-03 — End: 2018-01-04
  Administered 2018-01-04 (×3): via INTRAVENOUS

## 2018-01-03 MED ORDER — OXYTOCIN 40 UNITS IN LACTATED RINGERS INFUSION - SIMPLE MED
1.0000 m[IU]/min | INTRAVENOUS | Status: DC
  Filled 2018-01-03: qty 1000

## 2018-01-03 MED ORDER — OXYCODONE-ACETAMINOPHEN 5-325 MG PO TABS: 1.0000 | ORAL_TABLET | ORAL | Status: DC | PRN

## 2018-01-03 MED ORDER — PHENYLEPHRINE 40 MCG/ML (10ML) SYRINGE FOR IV PUSH (FOR BLOOD PRESSURE SUPPORT)
80.0000 ug | PREFILLED_SYRINGE | INTRAVENOUS | Status: DC | PRN
  Filled 2018-01-03: qty 10
  Filled 2018-01-03: qty 5

## 2018-01-03 MED ORDER — DIPHENHYDRAMINE HCL 50 MG/ML IJ SOLN: 12.5000 mg | INTRAMUSCULAR | Status: DC | PRN

## 2018-01-03 MED ORDER — OXYCODONE-ACETAMINOPHEN 5-325 MG PO TABS: 2.0000 | ORAL_TABLET | ORAL | Status: DC | PRN

## 2018-01-03 MED ORDER — PHENYLEPHRINE 40 MCG/ML (10ML) SYRINGE FOR IV PUSH (FOR BLOOD PRESSURE SUPPORT)
80.0000 ug | PREFILLED_SYRINGE | INTRAVENOUS | Status: DC | PRN
  Filled 2018-01-03: qty 5

## 2018-01-03 MED ORDER — ONDANSETRON HCL 4 MG/2ML IJ SOLN
4.0000 mg | Freq: Four times a day (QID) | INTRAMUSCULAR | Status: DC | PRN
  Administered 2018-01-04: 4 mg via INTRAVENOUS
  Filled 2018-01-03: qty 2

## 2018-01-03 MED ORDER — FENTANYL CITRATE (PF) 100 MCG/2ML IJ SOLN: 50.0000 ug | INTRAMUSCULAR | Status: DC | PRN

## 2018-01-03 MED ORDER — FLEET ENEMA 7-19 GM/118ML RE ENEM: 1.0000 | ENEMA | RECTAL | Status: DC | PRN

## 2018-01-03 MED ORDER — ONDANSETRON HCL 4 MG/2ML IJ SOLN: 4.0000 mg | Freq: Four times a day (QID) | INTRAMUSCULAR | Status: DC | PRN

## 2018-01-03 MED ORDER — LACTATED RINGERS IV SOLN: 500.0000 mL | INTRAVENOUS | Status: DC | PRN

## 2018-01-03 NOTE — Patient Instructions (Signed)
Ernest HaberKayleigh N Mabile, I greatly value your feedback.  If you receive a survey following your visit with us today, we appreciate you taking the time to fill it out.  Thanks, Joellyn HaffKim Booker, CNM, WHNP-BC   Call the office [phone]) or go to [facility] if:  You begin to have strong, frequent contractions  Your water breaks.  Sometimes it is a big gush of fluid, sometimes it is just a trickle that keeps getting your panties wet or running down your legs  You have vaginal bleeding.  It is normal to have a small amount of spotting if your cervix was checked.   You don't feel your baby moving like normal.  If you don't, get you something to eat and drink and lay down and focus on feeling your baby move.  You should feel at least 10 movements in 2 hours.  If you don't, you should call the office or go to [facility].     [facility] Contractions Contractions of the uterus can occur throughout pregnancy, but they are not always a sign that you are in labor. You may have practice contractions called Braxton Hicks contractions. These false labor contractions are sometimes confused with true labor. What are Deberah PeltonBraxton Hicks contractions? Braxton Hicks contractions are tightening movements that occur in the muscles of the uterus before labor. Unlike true labor contractions, these contractions do not result in opening (dilation) and thinning of the cervix. Toward the end of pregnancy (32-34 weeks), Braxton Hicks contractions can happen more often and may become stronger. These contractions are sometimes difficult to tell apart from true labor because they can be very uncomfortable. You should not feel embarrassed if you go to the hospital with false labor. Sometimes, the only way to tell if you are in true labor is for your health care provider to look for changes in the cervix. The health care provider will do a physical exam and may monitor your contractions. If you are not in true labor, the exam should  show that your cervix is not dilating and your water has not broken. If there are other health problems associated with your pregnancy, it is completely safe for you to be sent home with false labor. You may continue to have Braxton Hicks contractions until you go into true labor. How to tell the difference between true labor and false labor True labor  Contractions last 30-70 seconds.  Contractions become very regular.  Discomfort is usually felt in the top of the uterus, and it spreads to the lower abdomen and low back.  Contractions do not go away with walking.  Contractions usually become more intense and increase in frequency.  The cervix dilates and gets thinner. False labor  Contractions are usually shorter and not as strong as true labor contractions.  Contractions are usually irregular.  Contractions are often felt in the front of the lower abdomen and in the groin.  Contractions may go away when you walk around or change positions while lying down.  Contractions get weaker and are shorter-lasting as time goes on.  The cervix usually does not dilate or become thin. Follow these instructions at home:  Take over-the-counter and prescription medicines only as told by your health care provider.  Keep up with your usual exercises and follow other instructions from your health care provider.  Eat and drink lightly if you think you are going into labor.  If Braxton Hicks contractions are making you uncomfortable: ? Change your position from lying  down or resting to walking, or change from walking to resting. ? Sit and rest in a tub of warm water. ? Drink enough fluid to keep your urine pale yellow. Dehydration may cause these contractions. ? Do slow and deep breathing several times an hour.  Keep all follow-up prenatal visits as told by your health care provider. This is important. Contact a health care provider if:  You have a fever.  You have continuous pain in  your abdomen. Get help right away if:  Your contractions become stronger, more regular, and closer together.  You have fluid leaking or gushing from your vagina.  You pass blood-tinged mucus (bloody show).  You have bleeding from your vagina.  You have low back pain that you never had before.  You feel your baby's head pushing down and causing pelvic pressure.  Your baby is not moving inside you as much as it used to. Summary  Contractions that occur before labor are called Braxton Hicks contractions, false labor, or practice contractions.  Braxton Hicks contractions are usually shorter, weaker, farther apart, and less regular than true labor contractions. True labor contractions usually become progressively stronger and regular and they become more frequent.  Manage discomfort from [facility] contractions by changing position, resting in a warm bath, drinking plenty of water, or practicing deep breathing. This information is not intended to replace advice given to you by your health care provider. Make sure you discuss any questions you have with your health care provider. Document Released: 10/26/2016 Document Revised: 10/26/2016 Document Reviewed: 10/26/2016 Elsevier Interactive Patient Education  2018 [employer].

## 2018-01-03 NOTE — Treatment Plan (Signed)
Induction Assessment Scheduling Form Fax to [facility]:  [phone]  [name]                                                                                   DOB:  08/05/1995                                                            MRN:  [id number]                                                                     Phone #:   [phone]                         Provider:  [facility]  GP:  [id number]                                                            Estimated Date of Delivery: 12/29/17  Dating Criteria: LMP c/w 7wk u/s    Medical Indications for induction:  postdates Admission Date/Time:  7/13 @ 0800 Gestational age on admission:  41.0   There were no vitals filed for this visit. HIV:  Non Reactive (04/08 0908) GBS: Negative (06/13 1600)  3/80/-2, vtx   Method of induction(proposed):  Pit/arom   Scheduling Provider Signature:  Cheral MarkerKimberly R Aryeh Vasquez, CNM                                            Today's Date:  01/03/2018

## 2018-01-03 NOTE — H&P (Addendum)
OBSTETRIC ADMISSION HISTORY AND PHYSICAL  Patricia Vasquez is a 22 y.o. female G2P0010 with IUP at 84w5dby first trimester U/S presenting for SOL. She reports +FMs, No LOF, no VB, no blurry vision, headaches or peripheral edema, and RUQ pain.  She is having painful contractions every 3 minutes but is able to breathe through it for now. She plans on breast feeding. She request Depo for birth control. 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 Medical history non-contributory     Past Surgical History: Past Surgical History:  Procedure Laterality Date  . ADENOIDECTOMY    . TONSILLECTOMY      Obstetrical History: OB History    Gravida  2   Para  0   Term  0   Preterm  0   AB  1   Living  0     SAB  1   TAB  0   Ectopic  0   Multiple  0   Live Births  0           Social History: Social History   Socioeconomic History  . Marital status: Single    Spouse name: Not on file  . Number of children: Not on file  . Years of education: Not on file  . Highest education level: Not on file  Occupational History  . Not on file  Social Needs  . Financial resource strain: Not on file  . Food insecurity:    Worry: Not on file    Inability: Not on file  . Transportation needs:    Medical: Not on file    Non-medical: Not on file  Tobacco Use  . Smoking status: Former Smoker    Packs/day: 0.25    Types: Cigarettes  . Smokeless tobacco: Never Used  Substance and Sexual Activity  . Alcohol use: No  . Drug use: No   . Sexual activity: Yes    Birth control/protection: None  Lifestyle  . Physical activity:    Days per week: Not on file    Minutes per session: Not on file  . Stress: Not on file  Relationships  . Social connections:    Talks on phone: Not on file    Gets together: Not on file    Attends religious service: Not on file    Active member of club or organization: Not on file    Attends meetings of clubs or organizations: Not on file    Relationship status: Not on file  Other Topics Concern  . Not on file  Social History Narrative  . Not on file    Family History: Family History  Problem Relation Age of Onset  . Hypertension Father   . Cancer Maternal Grandfather        luekemia  . Cancer Paternal Grandfather        lung    Allergies: Allergies  Allergen Reactions  . Keflex [Cephalexin] Rash    Medications Prior to Admission  Medication Sig Dispense Refill Last Dose  . Prenat-FeCbn-FeAspGl-FA-Omega (OB COMPLETE PETITE) 35-5-1-200 MG CAPS Take  1 daily 30 capsule 12 Past Week at Unknown time     Review of Systems   All systems reviewed and negative except as stated in HPI  Blood pressure 121/73, pulse (!) 110, temperature 99 F (37.2 C), temperature source Oral, resp. rate 18, last menstrual period 03/24/2017, SpO2 99 %. General appearance: alert, cooperative and no distress Lungs: No respiratory distress Pelvic: adequate Presentation: vertex Fetal monitoringBaseline: 140 bpm, Variability: Good {> 6 bpm), Accelerations: present and Decelerations: Absent Uterine activityFrequency: Painful, every 2-3 minutes Dilation: 4 Effacement (%): 90 Station: -2 Exam by::  Maywood RN   Prenatal labs: ABO, Rh: B/Positive/-- (04/08 0908) Antibody: Negative (04/08 0908) Rubella: 2.67 (04/08 0908) RPR: Non Reactive (04/08 0908)  HBsAg: Negative (04/08 0908)  HIV: Non Reactive (04/08 0908)  GBS: Negative (06/13 1600)  2 hr GTT: Normal 71/95/74 Genetic screening NT/IT  Normal, CF neg Anatomy US: Normal Female @ [redacted]w[redacted]d Prenatal Transfer Tool  Maternal Diabetes: No Genetic Screening: Normal Maternal Ultrasounds/Referrals: Normal Fetal Ultrasounds or other Referrals:  None Maternal Substance Abuse:  Yes:  Type: Other: THC and Barbituates Significant Maternal Medications:  None Significant Maternal Lab Results: Lab values include: Group B Strep negative  Results for orders placed or performed in visit on 01/03/18 (from the past 24 hour(s))  POCT urinalysis dipstick   Collection Time: 01/03/18  2:22 PM  Result Value Ref Range   Color, UA     Clarity, UA     Glucose, UA Negative Negative   Bilirubin, UA     Ketones, UA neg    Spec Grav, UA  1.010 - 1.025   Blood, UA neg    pH, UA  5.0 - 8.0   Protein, UA Negative Negative   Urobilinogen, UA  0.2 or 1.0 E.U./dL   Nitrite, UA neg    Leukocytes, UA Negative Negative   Appearance     Odor      Patient Active Problem List   Diagnosis Date Noted  . Susceptible to varicella (non-immune), currently pregnant 10/10/2017  . Gallbladder sludge 10/01/2017  . Drug use 09/06/2017  . Supervision of normal pregnancy 05/30/2017  . Scabies exposure 02/21/2017  . Smoker 09/07/2016  . Breast mass, right 11/05/2013    Assessment/Plan:  KNACOLE FLUHRis a 22y.o. G2P0010 at 461w5dere for SOL.   #Labor: Early labor, Expectant management #Pain: Epidural upon pts request #FWB: Cat 1 #ID:  None #MOF: Breast #MOC: Depo #Circ:  Yes, at FT #Drug use in pregnancy: UDS on admission, social work consult  LeSherene SiresMedical Student  01/03/2018, 9:37 PM  I personally saw and evaluated the patient and agree with the content noted with my edits above.   AlRory PercyDO PGY-1, [facility] 01/03/2018 11:53 PM   I have spoken with and examined this patient and agree with resident/PA-S/Med-S/SNM's note and plan of care. VSS, HRR&R, Resp unlabored, Legs neg.  FrNigel Berthold CNM 01/04/2018 7:23 AM

## 2018-01-03 NOTE — Progress Notes (Signed)
   LOW-RISK PREGNANCY VISIT Patient name: Patricia Vasquez Sawyers MRN [id number]  Date of birth: 06/05/1996 Chief Complaint:   low risk ob (NST)  History of Present Illness:   Patricia Vasquez Comacho is a 22 y.o. G2P0010 female at 10942w5d with an Estimated Date of Delivery: 12/29/17 being seen today for ongoing management of a low-risk pregnancy.  Today she reports leaking fluid over the weekend, none now. Contractions: Irregular.  .  Movement: Present. denies leaking of fluid currently. Review of Systems:   Pertinent items are noted in HPI Denies abnormal vaginal discharge w/ itching/odor/irritation, headaches, visual changes, shortness of breath, chest pain, abdominal pain, severe nausea/vomiting, or problems with urination or bowel movements unless otherwise stated above. Pertinent History Reviewed:  Reviewed past medical,surgical, social, obstetrical and family history.  Reviewed problem list, medications and allergies. Physical Assessment:   Vitals:   01/03/18 1418  BP: 103/68  Pulse: (!) 109  Weight: 176 lb 9.6 oz (80.1 kg)  Body mass index is 32.3 kg/m.        Physical Examination:   General appearance: Well appearing, and in no distress  Mental status: Alert, oriented to person, place, and time  Skin: Warm & dry  Cardiovascular: Normal heart rate noted  Respiratory: Normal respiratory effort, no distress  Abdomen: Soft, gravid, nontender  Pelvic: Cervical exam performed  Dilation: 3 Effacement (%): 80 Station: -2  Extremities: Edema: None  Fetal Status: Fetal Heart Rate (bpm): 145 Fundal Height: 38 cm Movement: Present Presentation: Vertex  NST: FHR baseline 145 bpm, Variability: moderate, Accelerations:present, Decelerations:  Absent= Cat 1/Reactive Toco: occasional    Results for orders placed or performed in visit on 01/03/18 (from the past 24 hour(s))  POCT urinalysis dipstick   Collection Time: 01/03/18  2:22 PM  Result Value Ref Range   Color, UA     Clarity, UA     Glucose,  UA Negative Negative   Bilirubin, UA     Ketones, UA neg    Spec Grav, UA  1.010 - 1.025   Blood, UA neg    pH, UA  5.0 - 8.0   Protein, UA Negative Negative   Urobilinogen, UA  0.2 or 1.0 E.U./dL   Nitrite, UA neg    Leukocytes, UA Negative Negative   Appearance     Odor      Assessment & Plan:  1) Low-risk pregnancy G2P0010 at 2942w5d with an Estimated Date of Delivery: 12/29/17   2) Postdates, IOL scheduled for 7/13 @ 0800, declines membrane sweeping.  IOL form faxed via Epic and orders placed    Meds: No orders of the defined types were placed in this encounter.  Labs/procedures today: nst, sve  Plan:  Continue routine obstetrical care   Reviewed: Term labor symptoms and general obstetric precautions including but not limited to vaginal bleeding, contractions, leaking of fluid and fetal movement were reviewed in detail with the patient.  All questions were answered  Follow-up: Return in about 6 weeks (around 02/14/2018) for postpartum visit.  Orders Placed This Encounter  Procedures  . POCT urinalysis dipstick   Cheral MarkerKimberly R Linas Stepter CNM, [facility]NP[facility]BC 01/03/2018 2:59 PM

## 2018-01-03 NOTE — MAU Note (Signed)
Ctx since office visit, now 3 mins apart. Denies LOF or bleeding. +FM

## 2018-01-04 ENCOUNTER — Other Ambulatory Visit: Payer: Self-pay

## 2018-01-04 ENCOUNTER — Encounter (HOSPITAL_COMMUNITY): Payer: Self-pay

## 2018-01-04 ENCOUNTER — Inpatient Hospital Stay (HOSPITAL_COMMUNITY): Payer: Medicaid Other | Admitting: Anesthesiology

## 2018-01-04 DIAGNOSIS — Z3A40 40 weeks gestation of pregnancy: Secondary | ICD-10-CM

## 2018-01-04 LAB — RAPID URINE DRUG SCREEN, HOSP PERFORMED
Amphetamines: NOT DETECTED
BENZODIAZEPINES: NOT DETECTED
Cocaine: NOT DETECTED
Opiates: NOT DETECTED
Tetrahydrocannabinol: NOT DETECTED

## 2018-01-04 LAB — RPR: RPR: NONREACTIVE

## 2018-01-04 LAB — ABO/RH: ABO/RH(D): B POS

## 2018-01-04 MED ORDER — ONDANSETRON HCL 4 MG PO TABS: 4.0000 mg | ORAL_TABLET | ORAL | Status: DC | PRN

## 2018-01-04 MED ORDER — SIMETHICONE 80 MG PO CHEW: 80.0000 mg | CHEWABLE_TABLET | ORAL | Status: DC | PRN

## 2018-01-04 MED ORDER — PRENATAL MULTIVITAMIN CH
1.0000 | ORAL_TABLET | Freq: Every day | ORAL | Status: DC
  Administered 2018-01-05 – 2018-01-06 (×2): 1 via ORAL
  Filled 2018-01-04 (×2): qty 1

## 2018-01-04 MED ORDER — DIPHENHYDRAMINE HCL 25 MG PO CAPS: 25.0000 mg | ORAL_CAPSULE | Freq: Four times a day (QID) | ORAL | Status: DC | PRN

## 2018-01-04 MED ORDER — IBUPROFEN 600 MG PO TABS
600.0000 mg | ORAL_TABLET | Freq: Four times a day (QID) | ORAL | Status: DC
  Administered 2018-01-05 – 2018-01-06 (×7): 600 mg via ORAL
  Filled 2018-01-04 (×6): qty 1

## 2018-01-04 MED ORDER — ZOLPIDEM TARTRATE 5 MG PO TABS: 5.0000 mg | ORAL_TABLET | Freq: Every evening | ORAL | Status: DC | PRN

## 2018-01-04 MED ORDER — BENZOCAINE-MENTHOL 20-0.5 % EX AERO: 1.0000 "application " | INHALATION_SPRAY | CUTANEOUS | Status: DC | PRN

## 2018-01-04 MED ORDER — ACETAMINOPHEN 325 MG PO TABS
650.0000 mg | ORAL_TABLET | ORAL | Status: DC | PRN
  Administered 2018-01-05 – 2018-01-06 (×3): 650 mg via ORAL
  Filled 2018-01-04 (×3): qty 2

## 2018-01-04 MED ORDER — DIBUCAINE 1 % RE OINT: 1.0000 "application " | TOPICAL_OINTMENT | RECTAL | Status: DC | PRN

## 2018-01-04 MED ORDER — LIDOCAINE HCL (PF) 1 % IJ SOLN
INTRAMUSCULAR | Status: DC | PRN
  Administered 2018-01-04: 6 mL via EPIDURAL
  Administered 2018-01-04: 4 mL via EPIDURAL

## 2018-01-04 MED ORDER — SENNOSIDES-DOCUSATE SODIUM 8.6-50 MG PO TABS
2.0000 | ORAL_TABLET | ORAL | Status: DC
  Administered 2018-01-05 (×2): 2 via ORAL
  Filled 2018-01-04: qty 2

## 2018-01-04 MED ORDER — WITCH HAZEL-GLYCERIN EX PADS: 1.0000 "application " | MEDICATED_PAD | CUTANEOUS | Status: DC | PRN

## 2018-01-04 MED ORDER — OXYTOCIN 40 UNITS IN LACTATED RINGERS INFUSION - SIMPLE MED
1.0000 m[IU]/min | INTRAVENOUS | Status: DC
  Administered 2018-01-04: 2 m[IU]/min via INTRAVENOUS

## 2018-01-04 MED ORDER — TETANUS-DIPHTH-ACELL PERTUSSIS 5-2.5-18.5 LF-MCG/0.5 IM SUSP: 0.5000 mL | Freq: Once | INTRAMUSCULAR | Status: DC

## 2018-01-04 MED ORDER — TERBUTALINE SULFATE 1 MG/ML IJ SOLN
0.2500 mg | Freq: Once | INTRAMUSCULAR | Status: DC | PRN
  Filled 2018-01-04: qty 1

## 2018-01-04 MED ORDER — COCONUT OIL OIL: 1.0000 "application " | TOPICAL_OIL | Status: DC | PRN

## 2018-01-04 MED ORDER — ONDANSETRON HCL 4 MG/2ML IJ SOLN: 4.0000 mg | INTRAMUSCULAR | Status: DC | PRN

## 2018-01-04 NOTE — Anesthesia Procedure Notes (Signed)
Epidural Patient location during procedure: OB Start time: 01/04/2018 12:12 AM End time: 01/04/2018 12:16 AM  Staffing Anesthesiologist: Beryle LatheBrock, Morley Gaumer E, MD Performed: anesthesiologist   Preanesthetic Checklist Completed: patient identified, pre-op evaluation, timeout performed, IV checked, risks and benefits discussed and monitors and equipment checked  Epidural Patient position: sitting Prep: DuraPrep Patient monitoring: continuous pulse ox and blood pressure Approach: midline Location: L2-L3 Injection technique: LOR saline  Needle:  Needle type: Tuohy  Needle gauge: 17 G Needle length: 9 cm Needle insertion depth: 6 cm Catheter size: 19 Gauge Catheter at skin depth: 11 cm Test dose: negative and Other (1% lidocaine)  Additional Notes Patient identified. Risks including, but not limited to, bleeding, infection, nerve damage, paralysis, inadequate analgesia, blood pressure changes, nausea, vomiting, allergic reaction, postpartum back pain, itching, and headache were discussed. Patient expressed understanding and wished to proceed. Sterile prep and drape, including hand hygiene, mask, and sterile gloves were used. The patient was positioned and the spine was prepped. The skin was anesthetized with lidocaine. No paraesthesia or other complication noted. The patient did not experience any signs of intravascular injection such as tinnitus or metallic taste in mouth, nor signs of intrathecal spread such as rapid motor block. Please see nursing notes for vital signs. The patient tolerated the procedure well.   Leslye Peerhomas Tung Pustejovsky, MDReason for block:procedure for pain

## 2018-01-04 NOTE — Anesthesia Pain Management Evaluation Note (Signed)
  CRNA Pain Management Visit Note  Patient: Patricia Vasquez, 22 y.o., female  "Hello [facility]. We have an anesthesia team available at all times to provide care throughout the hospital, including epidural management and anesthesia for C-section. I don't know your plan for the delivery whether it a natural birth, water birth, IV sedation, nitrous supplementation, doula or epidural, but we want to meet your pain goals."   1.Was your pain managed to your expectations on prior hospitalizations?   No prior hospitalizations  2.What is your expectation for pain management during this hospitalization?     Epidural  3.How can we help you reach that goal? Support prn  Record the patient's initial score and the patient's pain goal.   Pain: 5 pt states pain level is manageable  Pain Goal: 6 [facility] wants you to be able to say your pain was always managed very well.  J[facility] Medical CenterWRINKLE,Patricia Gowin 01/04/2018

## 2018-01-04 NOTE — Progress Notes (Addendum)
Labor Progress Note  Patricia Vasquez is a 22 y.o. G2P0010 at 2445w6d  admitted for SOL.  S: Tolerating pain well, no complaints otherwise. Denies PIH symptoms.   O:  BP (!) 104/54   Pulse 95   Temp 98.9 F (37.2 C) (Oral)   Resp 16   Wt 80.3 kg (177 lb 1.3 oz)   LMP 03/24/2017 (Exact Date)   SpO2 99%   BMI 32.39 kg/m    FHT:  FHR: 150 bpm, variability: moderate,  accelerations:  Present,  decelerations:  Absent UC:   Occasional SVE:   Dilation: 4 Effacement (%): 80 Station: -1 Exam by:: Marius Ditchaniell Wade, RN Membranes still intact  Pitocin @ 2 mu/min  Labs: Lab Results  Component Value Date   WBC 19.3 (H) 01/03/2018   HGB 11.1 (L) 01/03/2018   HCT 33.8 (L) 01/03/2018   MCV 93.1 01/03/2018   PLT 411 (H) 01/03/2018    Assessment / Plan: 22 y.o. G2P0010 10845w6d in early labor.  Augmenting labor with Pitocin to increase UC frequency and intensity.   Labor: Cervical exam unchanged, Starting Pitocin to get into a good contraction pattern and progress cervical change Fetal Wellbeing:  Category I Pain Control:  Epidural Anticipated MOD:  NSVD  Expectant management   Patricia Vasquez, MS3 01/04/18 6:01 AM   I personally saw and evaluated the patient and agree with the content documented above in the student's note.    Ellwood DenseAlison Rumball, DO PGY-2,  Family Medicine 01/04/2018 9:15 AM

## 2018-01-04 NOTE — Progress Notes (Signed)
Patient ID: Patricia Vasquez, female   DOB: 02/11/1996, 22 y.o.   MRN: [id number]  Labor Progress Note  Patricia Vasquez is a 22 y.o. G2P0010 at 5573w6d  admitted for active labor  S: patient doing well.   Pain, PIH symptoms (HA, scotomata, RUQ pain)  O:  BP 120/76   Pulse (!) 119   Temp 99.2 F (37.3 C) (Oral)   Resp 16   Wt 80.3 kg (177 lb 1.3 oz)   LMP 03/24/2017 (Exact Date)   SpO2 99%   BMI 32.39 kg/m   Total I/O In: 499.6 [I.V.:499.6] Out: -   FHT:  FHR: 150 bpm, variability: moderate,  accelerations:  Present,  decelerations:  Absent UC:   regular, every 2-3 minutes SVE:   Dilation: 8 Effacement (%): 90 Station: -1 Exam by:: Iran OuchStephanie Russell, RN  SROM/AROM:  AROM peformed and minimal amount of meconium fluid came out.   Pitocin @ 6 mu/min  Labs: Lab Results  Component Value Date   WBC 19.3 (H) 01/03/2018   HGB 11.1 (L) 01/03/2018   HCT 33.8 (L) 01/03/2018   MCV 93.1 01/03/2018   PLT 411 (H) 01/03/2018    Assessment / Plan: 22 y.o. G2P0010 1973w6d  For SOL  in active labor Spontaneous labor, progressing normally  Labor: Progressing on Pitocin, AROM pef Fetal Wellbeing:  Category I Pain Control:  Epidural Anticipated MOD:  NSVD  Expectant management   @SIGN @

## 2018-01-04 NOTE — Progress Notes (Addendum)
Labor Progress Note  Patricia HaberKayleigh N Vasquez is a 22 y.o. G2P0010 at 2167w6d  admitted for SOL.  S: Pt pain well controlled now that she has the epidural. Feels pressure but that is all. No complaints otherwise.Denies PIH symptoms.   O:  BP 129/77   Pulse 99   Temp 98.7 F (37.1 C) (Oral)   Resp 16   Wt 80.3 kg (177 lb 1.3 oz)   LMP 03/24/2017 (Exact Date)   SpO2 99%   BMI 32.39 kg/m   No intake/output data recorded.  FHT:  FHR: 145 bpm, variability: moderate,  accelerations:  Present,  decelerations:  Absent UC:   regular, every 3-4 minutes SVE:   Dilation: 4.5 Effacement (%): 80 Station: -1 Exam by:: Ileana LaddLexie Ament, RN Membranes still intact    Labs: Lab Results  Component Value Date   WBC 19.3 (H) 01/03/2018   HGB 11.1 (L) 01/03/2018   HCT 33.8 (L) 01/03/2018   MCV 93.1 01/03/2018   PLT 411 (H) 01/03/2018    Assessment / Plan: 22 y.o. G2P0010 7467w6d in early labor. Spontaneous labor, progressing normally  Labor: Progressing normally, contraction pattern adequate, no need for Pitocin now but may augment later if needed. Fetal Wellbeing:  Category I Pain Control:  Epidural Anticipated MOD:  NSVD  Expectant management   [place] PinkLexi Betancourt, MS3 01/04/18   I personally saw and evaluated the patient and I agree with the student's documentation above.   Ellwood DenseAlison Ellayna Hilligoss, DO PGY-1, [facility] 01/04/2018 2:59 AM

## 2018-01-04 NOTE — Progress Notes (Signed)
Patient very insistent to start pushing.  Called out stating she "was going to push with or without yall".  Sterile vaginal exam - patient complete, +1 station with caput.  Patient states she "has to poop".  Patient breathes through contractions, and falls asleep/rests between contractions.  Pressure is not constant per patient.  Explained to patient that allowing the baby to descend more into her pelvis will assist her with pushing/delivery.  She states she "is pushing anyways".  Patient position changed to semi-fowlers, right tilt.  Support person and family at bedside.  Will continue to monitor.

## 2018-01-04 NOTE — Plan of Care (Signed)
Education performed, pt verbalized understanding.

## 2018-01-04 NOTE — Telephone Encounter (Signed)
Patient called stated she has been having "wetness" in her underwear for about a week that come and goes.  Says it is more like discharge but is unsure if her water has broken.  No contractions or bleeding noted by patient. Informed patient that it did not sound like ROM but if [facility] continued [facility].  Verbalized understanding.

## 2018-01-04 NOTE — Anesthesia Preprocedure Evaluation (Signed)
Anesthesia Evaluation  Patient identified by MRN, date of birth, ID band Patient awake    Reviewed: Allergy & Precautions, NPO status , Patient's Chart, lab work & pertinent test results  History of Anesthesia Complications Negative for: history of anesthetic complications  Airway Mallampati: II  TM Distance: >3 FB Neck ROM: Full    Dental  (+) Dental Advisory Given   Pulmonary former smoker,    breath sounds clear to auscultation       Cardiovascular negative cardio ROS   Rhythm:Regular Rate:Normal     Neuro/Psych negative neurological ROS  negative psych ROS   GI/Hepatic negative GI ROS, Neg liver ROS,   Endo/Other  Obesity   Renal/GU negative Renal ROS  negative genitourinary   Musculoskeletal negative musculoskeletal ROS (+)   Abdominal   Peds  Hematology  (+) anemia ,   Anesthesia Other Findings   Reproductive/Obstetrics (+) Pregnancy                             Anesthesia Physical Anesthesia Plan  ASA: II  Anesthesia Plan: Epidural   Post-op Pain Management:    Induction:   PONV Risk Score and Plan:   Airway Management Planned: Natural Airway  Additional Equipment: None  Intra-op Plan:   Post-operative Plan:   Informed Consent: I have reviewed the patients History and Physical, chart, labs and discussed the procedure including the risks, benefits and alternatives for the proposed anesthesia with the patient or authorized representative who has indicated his/her understanding and acceptance.     Plan Discussed with: Anesthesiologist  Anesthesia Plan Comments: (Labs reviewed. Platelets acceptable, patient not taking any blood thinning medications. Risks and benefits discussed with patient, patient expressed understanding and wished to proceed.)        Anesthesia Quick Evaluation

## 2018-01-05 ENCOUNTER — Inpatient Hospital Stay (HOSPITAL_COMMUNITY): Payer: Medicaid Other

## 2018-01-05 NOTE — Progress Notes (Addendum)
CSW received consult for MOB due to EPDS score of 10. CSW with MOB to discuss results of EPDS. MOB reports to CSW that she has had a good mood since delivery, but that she is tired and feels overwhelmed from lack of sleep. Newborn's name is Braylen. MOB reports that this is her first child and that she is breastfeeding. MOB reports receiving WIC and Food Stamps. MOB reports having a good support system at home, she lives with her mother and FOB. CSW encouraged MOB to reach out to CSW department or OB for assistance if needs arise, MOB stated agreement. MOB was educated on baby blues versus postpartum depression. MOB did not have further questions for CSW.  Since MOB's consult reason was for her EPDS score and not for a history of substance use, a full psychosocial assessment was not completed during initial interaction. Immediately following initial interaction, MOB and FOB were informed of hospital drug screening policies. MOB and FOB had multiple visitors entering as CSW was informing them of hospital policies. MOB UDS negative upon admission, results not yet available for newborn. MOB stated understanding of policies and did not have questions. CSW to follow urine & cord and make report to CPS if warranted.  Patricia Vasquez, MSW, LCSW-A Clinical Social Worker [facility] [phone]

## 2018-01-05 NOTE — Lactation Note (Signed)
This note was copied from a baby's chart. Lactation Consultation Note Baby 105 hours old. Mom states BF going well.  First baby. Mom has very little breast tissue to Lt. Breast w/very small everted nipple and slight rim around nipple of the areola. Can feel a little breast tissue to outer aspect of breast. Under nipple area is flat. Hand expression w/no colostrum noted to both breast.  Rt. Breast has some breast tissue w/slight roundness, tight feeling. Mom states had slight increase in breast. Nipple small but slightly larger than Lt. W/slight ring of areola around nipple.  Mom has personal DEBP. LC suggested to use hospital grade DEBP for extra stimulation. Mom agreed. Pump and kit taken into rm. Asked RN to set up.  Baby spit up clear fluid. LC changed blanket and crib sheet.  Baby wasn't swaddled well. LC swaddled. Tech took temp.  Newborn behavior discussed, I&O, STS, cluster feeding, supply and demand. Mom encouraged to feed baby 8-12 times/24 hours and with feeding cues. Wake baby if hasn't cued in 3 hrs. [place] brochure given w/resources, support groups and [facility] services.  Patient Name: Patricia Vasquez Today's Date: 01/05/2018 Reason for consult: Initial assessment   Maternal Data Has patient been taught Hand Expression?: Yes Does the patient have breastfeeding experience prior to this delivery?: No  Feeding Feeding Type: Breast Fed Length of feed: 40 min  LATCH Score Latch: Grasps breast easily, tongue down, lips flanged, rhythmical sucking.  Audible Swallowing: A few with stimulation  Type of Nipple: Everted at rest and after stimulation(small nipples)  Comfort (Breast/Nipple): Soft / non-tender  Hold (Positioning): No assistance needed to correctly position infant at breast.  LATCH Score: 9  Interventions Interventions: Breast feeding basics reviewed;Support pillows;Hand express;Breast compression;DEBP(asked RN to set up pump.)  Lactation Tools Discussed/Used Tools:  Pump Breast pump type: Double-Electric Breast Pump WIC Program: Yes   Consult Status      Wilhelmina Hark G 01/05/2018, 6:17 AM

## 2018-01-05 NOTE — Progress Notes (Signed)
POSTPARTUM PROGRESS NOTE  Post Partum Day #1 Subjective:  Patricia Vasquez is a 22 y.o. 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  No acute events overnight.  Pt denies problems with ambulating, voiding or po intake.  She denies nausea or vomiting.  Pain is well controlled. Lochia Small.   Objective: Blood pressure (!) 101/55, pulse 91, temperature 98.7 F (37.1 C), temperature source Oral, resp. rate 18, weight 80.3 kg (177 lb 1.3 oz), last menstrual period 03/24/2017, SpO2 99 %, unknown if currently breastfeeding.  Physical Exam:  General: alert, cooperative and no distress Lochia:normal flow Chest: no respiratory distress Heart:regular rate, distal pulses intact Abdomen: soft, nontender Uterine Fundus: firm, appropriately tender DVT Evaluation: No calf swelling or tenderness Extremities: No edema  Recent Labs    01/03/18 2229  HGB 11.1*  HCT 33.8*    Assessment/Plan:  ASSESSMENT: Patricia HaberKayleigh N Sakai is a 22 y.o. G2P1011 7134w6d s/p SVD following SOL.  Plan for discharge tomorrow, Breastfeeding, Lactation consult, Social Work consult and Contraception Depo   LOS: 2 days   Rolland BimlerLexi J Lenzy Kerschner, MS3 01/05/2018, 7:38 AM

## 2018-01-05 NOTE — Anesthesia Postprocedure Evaluation (Signed)
Anesthesia Post Note  Patient: Patricia Vasquez  Procedure(s) Performed: AN AD HOC LABOR EPIDURAL     Patient location during evaluation: Mother Baby Anesthesia Type: Epidural Level of consciousness: awake and alert Pain management: pain level controlled Vital Signs Assessment: post-procedure vital signs reviewed and stable Respiratory status: spontaneous breathing, nonlabored ventilation and respiratory function stable Cardiovascular status: stable Postop Assessment: no headache, no backache, epidural receding and patient able to bend at knees Anesthetic complications: no    Last Vitals:  Vitals:   01/05/18 0300 01/05/18 0446  BP: 110/62 (!) 101/55  Pulse: 92 91  Resp: 18 18  Temp: 36.9 C 37.1 C  SpO2: 100% 99%    Last Pain:  Vitals:   01/05/18 0508  TempSrc:   PainSc: 0-No pain   Pain Goal: Patients Stated Pain Goal: 8 (01/03/18 2251)               Rica RecordsICKELTON,Eric Nees

## 2018-01-06 MED ORDER — IBUPROFEN 600 MG PO TABS: 600.0000 mg | ORAL_TABLET | Freq: Four times a day (QID) | ORAL | 0 refills | Status: DC

## 2018-01-06 NOTE — Lactation Note (Signed)
This note was copied from a baby's chart. Lactation Consultation Note  Patient Name: Patricia Vasquez ZOXWR'UToday's Date: 01/06/2018 Reason for consult: Follow-up assessment Mom mostly pumping and giving formula bottles.  She obtains small amounts of colostrum.  I asked mom what her plan for feeding was.  She said she will work on Administratorlatching baby at home.  Recommended she call me for assist before discharge.  Lactation outpatient services and support reviewed and encouraged prn.  Mom does have her own breast pump for home use.  Maternal Data    Feeding    LATCH Score                   Interventions    Lactation Tools Discussed/Used     Consult Status Consult Status: Complete    Huston FoleyMOULDEN, Eldana Isip S 01/06/2018, 8:54 AM

## 2018-01-06 NOTE — Discharge Instructions (Signed)
Vaginal Delivery, Care After °Refer to this sheet in the next few weeks. These instructions provide you with information about caring for yourself after vaginal delivery. Your health care provider may also give you more specific instructions. Your treatment has been planned according to current medical practices, but problems sometimes occur. Call your health care provider if you have any problems or questions. °What can I expect after the procedure? °After vaginal delivery, it is common to have: °· Some bleeding from your vagina. °· Soreness in your abdomen, your vagina, and the area of skin between your vaginal opening and your anus (perineum). °· Pelvic cramps. °· Fatigue. ° °Follow these instructions at home: °Medicines °· Take over-the-counter and prescription medicines only as told by your health care provider. °· If you were prescribed an antibiotic medicine, take it as told by your health care provider. Do not stop taking the antibiotic until it is finished. °Driving ° °· Do not drive or operate heavy machinery while taking prescription pain medicine. °· Do not drive for 24 hours if you received a sedative. °Lifestyle °· Do not drink alcohol. This is especially important if you are breastfeeding or taking medicine to relieve pain. °· Do not use tobacco products, including cigarettes, chewing tobacco, or e-cigarettes. If you need help quitting, ask your health care provider. °Eating and drinking °· Drink at least 8 eight-ounce glasses of water every day unless you are told not to by your health care provider. If you choose to breastfeed your baby, you may need to drink more water than this. °· Eat high-fiber foods every day. These foods may help prevent or relieve constipation. High-fiber foods include: °? Whole grain cereals and breads. °? Brown rice. °? Beans. °? Fresh fruits and vegetables. °Activity °· Return to your normal activities as told by your health care provider. Ask your health care provider  what activities are safe for you. °· Rest as much as possible. Try to rest or take a nap when your baby is sleeping. °· Do not lift anything that is heavier than your baby or 10 lb (4.5 kg) until your health care provider says that it is safe. °· Talk with your health care provider about when you can engage in sexual activity. This may depend on your: °? Risk of infection. °? Rate of healing. °? Comfort and desire to engage in sexual activity. °Vaginal Care °· If you have an episiotomy or a vaginal tear, check the area every day for signs of infection. Check for: °? More redness, swelling, or pain. °? More fluid or blood. °? Warmth. °? Pus or a bad smell. °· Do not use tampons or douches until your health care provider says this is safe. °· Watch for any blood clots that may pass from your vagina. These may look like clumps of dark red, brown, or black discharge. °General instructions °· Keep your perineum clean and dry as told by your health care provider. °· Wear loose, comfortable clothing. °· Wipe from front to back when you use the toilet. °· Ask your health care provider if you can shower or take a bath. If you had an episiotomy or a perineal tear during labor and delivery, your health care provider may tell you not to take baths for a certain length of time. °· Wear a bra that supports your breasts and fits you well. °· If possible, have someone help you with household activities and help care for your baby for at least a few days after   you leave the hospital. °· Keep all follow-up visits for you and your baby as told by your health care provider. This is important. °Contact a health care provider if: °· You have: °? Vaginal discharge that has a bad smell. °? Difficulty urinating. °? Pain when urinating. °? A sudden increase or decrease in the frequency of your bowel movements. °? More redness, swelling, or pain around your episiotomy or vaginal tear. °? More fluid or blood coming from your episiotomy or  vaginal tear. °? Pus or a bad smell coming from your episiotomy or vaginal tear. °? A fever. °? A rash. °? Little or no interest in activities you used to enjoy. °? Questions about caring for yourself or your baby. °· Your episiotomy or vaginal tear feels warm to the touch. °· Your episiotomy or vaginal tear is separating or does not appear to be healing. °· Your breasts are painful, hard, or turn red. °· You feel unusually sad or worried. °· You feel nauseous or you vomit. °· You pass large blood clots from your vagina. If you pass a blood clot from your vagina, save it to show to your health care provider. Do not flush blood clots down the toilet without having your health care provider look at them. °· You urinate more than usual. °· You are dizzy or light-headed. °· You have not breastfed at all and you have not had a menstrual period for 12 weeks after delivery. °· You have stopped breastfeeding and you have not had a menstrual period for 12 weeks after you stopped breastfeeding. °Get help right away if: °· You have: °? Pain that does not go away or does not get better with medicine. °? Chest pain. °? Difficulty breathing. °? Blurred vision or spots in your vision. °? Thoughts about hurting yourself or your baby. °· You develop pain in your abdomen or in one of your legs. °· You develop a severe headache. °· You faint. °· You bleed from your vagina so much that you fill two sanitary pads in one hour. °This information is not intended to replace advice given to you by your health care provider. Make sure you discuss any questions you have with your health care provider. °Document Released: 06/09/2000 Document Revised: 11/24/2015 Document Reviewed: 06/27/2015 °Elsevier Interactive Patient Education © 2018 [employer] ° °

## 2018-01-06 NOTE — Discharge Summary (Addendum)
OB Discharge Summary     Patient Name: Patricia Vasquez DOB: 04/13/1996 MRN: [id number]  Date of admission: 01/03/2018 Delivering MD: Raelyn MoraAWSON, ROLITTA   Date of discharge: 01/06/2018  Admitting diagnosis: 40 WKS, CTX Intrauterine pregnancy: 7553w6d     Secondary diagnosis:  Active Problems:   Normal labor   Post-dates pregnancy   SVD (spontaneous vaginal delivery)  Additional problems: UDS pos. For barbiturates/THC. Varicella non-immue.     Discharge diagnosis: Term Pregnancy Delivered                                                                                                Post partum procedures:none  Augmentation: AROM and Pitocin  Complications: None  Hospital course:  Onset of Labor With Vaginal Delivery     22 y.o. yo G2P1011 at 5853w6d was admitted in Active Labor on 01/03/2018. Patient had an uncomplicated labor course as follows:  Membrane Rupture Time/Date: 12:03 PM ,01/04/2018   Intrapartum Procedures: Episiotomy: None [1]                                         Lacerations:  None [1]  Patient had a delivery of a Viable infant. 01/04/2018  Information for the patient's newborn:  Angelyn PuntVernon, BoyA Imelda [[id number]][[id number]]  Delivery Method: Vag-Spont    Pateint had an uncomplicated postpartum course.  She is ambulating, tolerating a regular diet, passing flatus, and urinating well. Patient is discharged home in stable condition on 01/06/18.   Physical exam  Vitals:   01/05/18 0930 01/05/18 1501 01/05/18 2332 01/06/18 0611  BP: 113/70 119/61 (!) 118/51 107/60  Pulse: 89 (!) 105 (!) 103 78  Resp: 18 18  18   Temp: 98 F (36.7 C) 98.3 F (36.8 C)  98.2 F (36.8 C)  TempSrc: Oral Oral  Oral  SpO2:      Weight:       General: alert, cooperative and no distress Lochia: appropriate Uterine Fundus: firm Incision: N/A DVT Evaluation: No evidence of DVT seen on physical exam. Labs: Lab Results  Component Value Date   WBC 19.3 (H) 01/03/2018   HGB 11.1 (L) 01/03/2018    HCT 33.8 (L) 01/03/2018   MCV 93.1 01/03/2018   PLT 411 (H) 01/03/2018   No flowsheet data found.  Discharge instruction: per After Visit Summary and "Baby and Me Booklet".  After visit meds:  Allergies as of 01/06/2018      Reactions   Keflex [cephalexin] Rash      Medication List    STOP taking these medications   OB COMPLETE PETITE 35-5-1-200 MG Caps     TAKE these medications   ibuprofen 600 MG tablet Commonly known as:  ADVIL,MOTRIN Take 1 tablet (600 mg total) by mouth every 6 (six) hours.       Diet: routine diet  Activity: Advance as tolerated. Pelvic rest for 6 weeks.   Outpatient follow up:6 weeks Follow up Appt:No future appointments. Follow up Visit: Follow-up Information    [facility] Follow up in 6 week(s).   Specialty:  Obstetrics and Gynecology Why:  schedule follow up appointment Contact information: [address] [place] [address] [phone]          Postpartum contraception: Depo Provera  Newborn Data: Live born female  Birth Weight: 7 lb 4.6 oz (3306 g) APGAR: 5, 7  Newborn Delivery   Birth date/time:  01/04/2018 19:58:00 Delivery type:  Vaginal, Spontaneous     Baby Feeding: Bottle and Breast Disposition:home with mother   01/06/2018 Sandre Kitty, MD  OB FELLOW DISCHARGE ATTESTATION  I have seen and examined this patient. I agree with above documentation and have made edits as needed.   Caryl Ada, DO

## 2018-01-07 ENCOUNTER — Telehealth: Payer: Self-pay | Admitting: *Deleted

## 2018-01-07 NOTE — Telephone Encounter (Signed)
Lmom for pt to call us back to set up pp appt.  01-07-18  AS

## 2018-01-07 NOTE — Telephone Encounter (Signed)
Patient states she has swelling in her ankles and feet since having the baby and wants to make sure it is normal.  Informed patient the swelling may get worse before getting better and to keep legs elevated when she can and push fluids.  Verbalized understanding.

## 2018-01-08 ENCOUNTER — Other Ambulatory Visit: Payer: Self-pay | Admitting: Advanced Practice Midwife

## 2018-01-08 ENCOUNTER — Telehealth: Payer: Self-pay | Admitting: *Deleted

## 2018-01-08 MED ORDER — FERROUS SULFATE 325 (65 FE) MG PO TABS: 325.0000 mg | ORAL_TABLET | Freq: Every day | ORAL | 1 refills | Status: DC

## 2018-01-08 NOTE — Telephone Encounter (Signed)
Pt aware Iron was sent to pharmacy. Advised to take prenatal vit and iron everyday. Pt voiced understanding. JSY

## 2018-01-08 NOTE — Telephone Encounter (Signed)
Patricia Vasquez with [facility] called about pt's hemoglobin. Pt's hemoglobin today was 8.2. She had a vaginal delivery on 01/04/18. She has not been taking prenatal vits since coming home. Pt needs a refill on prenatal vits. Please advise. Thanks!! JSY

## 2018-01-08 NOTE — Progress Notes (Signed)
feso4 for low hgb

## 2018-01-08 NOTE — Telephone Encounter (Signed)
[facility]

## 2018-01-09 ENCOUNTER — Telehealth: Payer: Self-pay | Admitting: *Deleted

## 2018-01-09 NOTE — Telephone Encounter (Signed)
Lmom for pt to call us back to schedule pp appt.  01-09-18  AS

## 2018-02-13 ENCOUNTER — Encounter: Payer: Self-pay | Admitting: Obstetrics & Gynecology

## 2018-02-13 ENCOUNTER — Ambulatory Visit: Payer: Medicaid Other | Admitting: Women's Health

## 2018-02-21 ENCOUNTER — Encounter: Payer: Self-pay | Admitting: Women's Health

## 2018-02-21 ENCOUNTER — Ambulatory Visit (INDEPENDENT_AMBULATORY_CARE_PROVIDER_SITE_OTHER): Payer: Medicaid Other | Admitting: Women's Health

## 2018-02-21 DIAGNOSIS — F53 Postpartum depression: Secondary | ICD-10-CM | POA: Insufficient documentation

## 2018-02-21 DIAGNOSIS — K828 Other specified diseases of gallbladder: Secondary | ICD-10-CM

## 2018-02-21 DIAGNOSIS — O99345 Other mental disorders complicating the puerperium: Secondary | ICD-10-CM

## 2018-02-21 DIAGNOSIS — Z3202 Encounter for pregnancy test, result negative: Secondary | ICD-10-CM | POA: Diagnosis not present

## 2018-02-21 LAB — POCT URINE PREGNANCY: Preg Test, Ur: NEGATIVE

## 2018-02-21 MED ORDER — MEDROXYPROGESTERONE ACETATE 150 MG/ML IM SUSP: 150.0000 mg | INTRAMUSCULAR | 3 refills | Status: DC

## 2018-02-21 NOTE — Patient Instructions (Signed)
NO SEX UNTIL AFTER YOU GET YOUR BIRTH CONTROL  

## 2018-02-21 NOTE — Progress Notes (Signed)
POSTPARTUM VISIT Patient name: Patricia Vasquez MRN [id number]  Date of birth: 02/25/1996 Chief Complaint:   postpartum visit (interested in Depo)  History of Present Illness:   Patricia Vasquez is a 22 y.o. 332P1011 Caucasian female being seen today for a postpartum visit. She is 6 weeks postpartum following a spontaneous vaginal delivery at 40.6 gestational weeks. Anesthesia: epidural. I have fully reviewed the prenatal and intrapartum course. Pregnancy uncomplicated. Had gallbladder pain/sludge during pregnancy, hasn't bothered her since delivery.  Postpartum course has been uncomplicated. Bleeding no bleeding. Bowel function is normal. Bladder function is normal.  Patient is sexually active. Last sexual activity: 8/22.  Contraception method is condoms, wants depo .  [place] Postpartum Depression Screening: positive. Score 17. 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 Eating/sleeping well, finds joy in most things. Denies SI/HI/II. Feels more good days than bad.    Last pap never, just turned 22yo.  Results were n/a .  Patient's last menstrual period was 03/24/2017 (exact date).  Baby's course has been uncomplicated. Baby is feeding by bottle.  Review of Systems:   Pertinent items are noted in HPI Denies Abnormal vaginal discharge w/ itching/odor/irritation, headaches, visual changes, shortness of breath, chest pain, abdominal pain, severe nausea/vomiting, or problems with urination or bowel movements. Pertinent History Reviewed:  Reviewed past medical,surgical, obstetrical and family history.  Reviewed problem list, medications and allergies. OB History  Gravida Para Term Preterm AB Living  2 1 1  0 1 1  SAB TAB Ectopic Multiple Live Births  1 0 0 0 1    # Outcome Date GA Lbr Len/2nd Weight Sex Delivery Anes PTL Lv  2 Term 01/04/18 5476w6d 22:45 / 06:13 7 lb 4.6 oz (3.306 kg) M Vag-Spont EPI  LIV  1 SAB 09/2016           Physical Assessment:   Vitals:   02/21/18  1416  BP: 122/68  Pulse: (!) 101  Weight: 162 lb 8 oz (73.7 kg)  Height: 5\' 2"  (1.575 m)  Body mass index is 29.72 kg/m.       Physical Examination:   General appearance: alert, well appearing, and in no distress  Mental status: alert, oriented to person, place, and time  Skin: warm & dry   Cardiovascular: normal heart rate noted   Respiratory: normal respiratory effort, no distress   Breasts: deferred, no complaints   Abdomen: soft, non-tender   Pelvic: VULVA: normal appearing vulva with no masses, tenderness or lesions, UTERUS: uterus is normal size, shape, consistency and nontender  Rectal: no hemorrhoids  Extremities: no edema       Results for orders placed or performed in visit on 02/21/18 (from the past 24 hour(s))  POCT urine pregnancy   Collection Time: 02/21/18  2:21 PM  Result Value Ref Range   Preg Test, Ur Negative Negative    Assessment & Plan:  1) Postpartum exam 2) 6 wks s/p SVB 3) Bottlefeeding 4) Depression screening 5) Contraception counseling, pt prefers Depo-Provera injections, understands can worsen dep/anx, will let us know 6) PPD> doing well right now, counseling w/ [facility], let us or them know if needs meds 7) Resolved gallbladder sx  Meds:  Meds ordered this encounter  Medications  . medroxyPROGESTERone (DEPO-PROVERA) 150 MG/ML injection    Sig: Inject 1 mL (150 mg total) into the muscle every 3 (three) months.    Dispense:  1 mL    Refill:  3    Order Specific Question:  Supervising Provider    Answer:   Lazaro Arms [[id number]]    Follow-up: Return for 9/5 for depo, then for pap & physical.   Orders Placed This Encounter  Procedures  . POCT urine pregnancy    Cheral Marker CNM, [facility] 02/21/2018 2:51 PM

## 2018-02-28 ENCOUNTER — Ambulatory Visit: Payer: Medicaid Other

## 2018-03-21 ENCOUNTER — Other Ambulatory Visit: Payer: Medicaid Other | Admitting: Women's Health

## 2018-08-28 NOTE — Telephone Encounter (Signed)
Note sent to nurse. 

## 2019-05-07 ENCOUNTER — Emergency Department (HOSPITAL_COMMUNITY)
Admission: EM | Admit: 2019-05-07 | Discharge: 2019-05-07 | Disposition: A | Discharge status: Home / Self Care | Payer: Medicaid Other | Attending: Emergency Medicine | Admitting: Emergency Medicine

## 2019-05-07 ENCOUNTER — Other Ambulatory Visit: Payer: Self-pay

## 2019-05-07 ENCOUNTER — Encounter (HOSPITAL_COMMUNITY): Payer: Self-pay | Admitting: Emergency Medicine

## 2019-05-07 ENCOUNTER — Emergency Department (HOSPITAL_COMMUNITY): Payer: Medicaid Other

## 2019-05-07 DIAGNOSIS — H1031 Unspecified acute conjunctivitis, right eye: Secondary | ICD-10-CM | POA: Diagnosis not present

## 2019-05-07 DIAGNOSIS — L03213 Periorbital cellulitis: Secondary | ICD-10-CM | POA: Insufficient documentation

## 2019-05-07 DIAGNOSIS — F17210 Nicotine dependence, cigarettes, uncomplicated: Secondary | ICD-10-CM | POA: Insufficient documentation

## 2019-05-07 DIAGNOSIS — H5711 Ocular pain, right eye: Secondary | ICD-10-CM | POA: Diagnosis present

## 2019-05-07 LAB — CBC WITH DIFFERENTIAL/PLATELET
Abs Immature Granulocytes: 0.04 10*3/uL (ref 0.00–0.07)
Basophils Absolute: 0.1 10*3/uL (ref 0.0–0.1)
Basophils Relative: 1 %
Eosinophils Absolute: 0.1 10*3/uL (ref 0.0–0.5)
Eosinophils Relative: 1 %
HCT: 39.1 % (ref 36.0–46.0)
Hemoglobin: 12.6 g/dL (ref 12.0–15.0)
Immature Granulocytes: 0 %
Lymphocytes Relative: 27 %
Lymphs Abs: 2.9 10*3/uL (ref 0.7–4.0)
MCH: 30.9 pg (ref 26.0–34.0)
MCHC: 32.2 g/dL (ref 30.0–36.0)
MCV: 95.8 fL (ref 80.0–100.0)
Monocytes Absolute: 0.8 10*3/uL (ref 0.1–1.0)
Monocytes Relative: 7 %
Neutro Abs: 6.9 10*3/uL (ref 1.7–7.7)
Neutrophils Relative %: 64 %
Platelets: 363 10*3/uL (ref 150–400)
RBC: 4.08 MIL/uL (ref 3.87–5.11)
RDW: 14.3 % (ref 11.5–15.5)
WBC: 10.8 10*3/uL — ABNORMAL HIGH (ref 4.0–10.5)
nRBC: 0 % (ref 0.0–0.2)

## 2019-05-07 LAB — BASIC METABOLIC PANEL
Anion gap: 9 (ref 5–15)
BUN: 11 mg/dL (ref 6–20)
CO2: 23 mmol/L (ref 22–32)
Calcium: 9.2 mg/dL (ref 8.9–10.3)
Chloride: 105 mmol/L (ref 98–111)
Creatinine, Ser: 0.66 mg/dL (ref 0.44–1.00)
GFR calc Af Amer: >60 mL/min (ref >60)
GFR calc non Af Amer: >60 mL/min (ref >60)
Glucose, Bld: 88 mg/dL (ref 70–99)
Potassium: 3.9 mmol/L (ref 3.5–5.1)
Sodium: 137 mmol/L (ref 135–145)

## 2019-05-07 LAB — I-STAT BETA HCG BLOOD, ED (MC, WL, AP ONLY): I-stat hCG, quantitative: <5 m[IU]/mL (ref <5)

## 2019-05-07 MED ORDER — FLUORESCEIN SODIUM 1 MG OP STRP
1.0000 | ORAL_STRIP | Freq: Once | OPHTHALMIC | Status: DC
  Filled 2019-05-07: qty 1

## 2019-05-07 MED ORDER — IOHEXOL 300 MG/ML  SOLN
75.0000 mL | Freq: Once | INTRAMUSCULAR | Status: AC | PRN
  Administered 2019-05-07: 75 mL via INTRAVENOUS

## 2019-05-07 MED ORDER — HYDROCODONE-ACETAMINOPHEN 5-325 MG PO TABS
1.0000 | ORAL_TABLET | Freq: Once | ORAL | Status: AC
  Administered 2019-05-07: 17:00:00 1 via ORAL
  Filled 2019-05-07: qty 1

## 2019-05-07 MED ORDER — TETRACAINE HCL 0.5 % OP SOLN
2.0000 [drp] | Freq: Once | OPHTHALMIC | Status: DC
  Filled 2019-05-07: qty 4

## 2019-05-07 MED ORDER — SULFAMETHOXAZOLE-TRIMETHOPRIM 800-160 MG PO TABS: 1.0000 | ORAL_TABLET | Freq: Two times a day (BID) | ORAL | 0 refills | Status: AC

## 2019-05-07 MED ORDER — CIPROFLOXACIN HCL 0.3 % OP OINT: TOPICAL_OINTMENT | OPHTHALMIC | 0 refills | Status: DC

## 2019-05-07 NOTE — ED Notes (Signed)
Patient transported to CT 

## 2019-05-07 NOTE — ED Provider Notes (Signed)
[facility] EMERGENCY DEPARTMENT Provider Note   CSN: [id number] Arrival date & time: 05/07/19  1356    History   Chief Complaint Chief Complaint  Patient presents with  . Eye Problem    HPI Patricia Vasquez is a 23 y.o. female with has medical history significant for corneal ulcers who presents for evaluation of right eye pain.  Patient states she has had right eye pain x5 days.  Patient states she has had progressively worsening pruritus, erythema to her right eye.  Patient states she feels like her eyelid is now swollen.  Patient states she feels like she is unable to open her eyes secondary to pain.  States she does use contacts.  Her last contact use was 2 weeks ago.  Patient states she does have prior history of corneal ulcers from "dirty contacts."  She denies any blurred vision or photophobia.  Denies any recent injury or trauma.  Denies any purulent drainage, fever, chills, neck pain, neck stiffness, nausea, vomiting. Admits to clear drainage to eye.  History obtained from patient and past medical history. No interpretor was used.     HPI  Past Medical History:  Diagnosis Date  . Medical history non-contributory     Patient Active Problem List   Diagnosis Date Noted  . Postpartum depression 02/21/2018  . Gallbladder sludge 10/01/2017  . Drug use 09/06/2017  . Scabies exposure 02/21/2017  . Smoker 09/07/2016  . Breast mass, right 11/05/2013    Past Surgical History:  Procedure Laterality Date  . ADENOIDECTOMY    . TONSILLECTOMY       OB History    Gravida  2   Para  1   Term  1   Preterm  0   AB  1   Living  1     SAB  1   TAB  0   Ectopic  0   Multiple  0   Live Births  1            Home Medications    Prior to Admission medications   Medication Sig Start Date End Date Taking? Authorizing Provider  ciprofloxacin (CILOXAN) 0.3 % ophthalmic ointment Place into right eye daily 05/07/19   Nakeshia Waldeck A, PA-C  ferrous sulfate  325 (65 FE) MG tablet Take 1 tablet (325 mg total) by mouth daily with breakfast. Patient taking differently: Take 325 mg by mouth once a week.  01/08/18   Cresenzo-Dishmon, Scarlette Calico, CNM  ibuprofen (ADVIL,MOTRIN) 600 MG tablet Take 1 tablet (600 mg total) by mouth every 6 (six) hours. 01/06/18   Sandre Kitty, MD  medroxyPROGESTERone (DEPO-PROVERA) 150 MG/ML injection Inject 1 mL (150 mg total) into the muscle every 3 (three) months. 02/21/18   Cheral Marker, CNM  sulfamethoxazole-trimethoprim (BACTRIM DS) 800-160 MG tablet Take 1 tablet by mouth 2 (two) times daily for 7 days. 05/07/19 05/14/19  Demyan Fugate A, PA-C    Family History Family History  Problem Relation Age of Onset  . Hypertension Father   . Cancer Maternal Grandfather        luekemia  . Cancer Paternal Grandfather        lung    Social History Social History   Tobacco Use  . Smoking status: Current Every Day Smoker    Packs/day: 0.50    Years: 6.00    Pack years: 3.00    Types: Cigarettes  . Smokeless tobacco: Never Used  Substance Use Topics  . Alcohol use: Yes  .  Drug use: No     Allergies   Keflex [cephalexin]   Review of Systems Review of Systems  Constitutional: Negative.   HENT: Negative.   Eyes: Positive for pain, discharge, redness and itching. Negative for photophobia and visual disturbance.  Respiratory: Negative.   Cardiovascular: Negative.   Endocrine: Negative.   Genitourinary: Negative.   Musculoskeletal: Negative.   Skin: Negative.   Neurological: Negative.   All other systems reviewed and are negative.    Physical Exam Updated Vital Signs BP 127/85 (BP Location: Right Arm)   Pulse (!) 118   Temp 98.7 F (37.1 C) (Oral)   Resp 17   Ht 5\' 2"  (1.575 m)   Wt 72.6 kg   LMP 04/21/2019   SpO2 100%   BMI 29.26 kg/m   Physical Exam Vitals signs and nursing note reviewed.  Constitutional:      General: She is not in acute distress.    Appearance: She is  well-developed. She is not ill-appearing, toxic-appearing or diaphoretic.  HENT:     Head: Normocephalic and atraumatic.     Jaw: There is normal jaw occlusion.  Eyes:     General: Vision grossly intact. No allergic shiner, visual field deficit or scleral icterus.       Right eye: Discharge present. No foreign body or hordeolum.        Left eye: No foreign body, discharge or hordeolum.     Intraocular pressure: Right eye pressure is 14 mmHg. Left eye pressure is 16 mmHg.     Extraocular Movements: Extraocular movements intact.     Conjunctiva/sclera:     Right eye: Right conjunctiva is injected. No chemosis, exudate or hemorrhage.    Left eye: Left conjunctiva is not injected. No chemosis, exudate or hemorrhage.    Pupils: Pupils are equal, round, and reactive to light.     Funduscopic exam:    Right eye: No hemorrhage, exudate, AV nicking, arteriolar narrowing or papilledema. Red reflex and venous pulsations present.        Left eye: No hemorrhage, exudate, AV nicking, arteriolar narrowing or papilledema. Red reflex and venous pulsations present.    Slit lamp exam:    Right eye: Anterior chamber quiet.     Left eye: Anterior chamber quiet.     Visual Fields: Right eye visual fields normal and left eye visual fields normal.     Comments: Mild swelling and erythema surrounding right upper and lower lip. No induration or fluctuance. Copious clear watery drainage to right eye. No cloudy corneal bilaterally. PERRLA. Mild pain with EOM to right eye. IOP right 14,15,14 left 16,16,15. Right conjunctival with injection.  Neck:     Musculoskeletal: Normal range of motion.  Cardiovascular:     Rate and Rhythm: Normal rate.  Pulmonary:     Effort: No respiratory distress.  Abdominal:     General: There is no distension.  Musculoskeletal: Normal range of motion.  Skin:    General: Skin is warm and dry.  Neurological:     Mental Status: She is alert.    ED Treatments / Results  Labs (all  labs ordered are listed, but only abnormal results are displayed) Labs Reviewed  CBC WITH DIFFERENTIAL/PLATELET - Abnormal; Notable for the following components:      Result Value   WBC 10.8 (*)    All other components within normal limits  BASIC METABOLIC PANEL  I-STAT BETA HCG BLOOD, ED ([facility], [facility], [facility] ONLY)    EKG None  Radiology No results found.   CLINICAL DATA: Headache, basilar or orbital. Patient reports redness and irritation to right eye that started 4 days ago. Concern for cellulitis.  EXAM: CT ORBITS WITH CONTRAST  TECHNIQUE: Multidetector CT images was performed according to the standard protocol following intravenous contrast administration.  CONTRAST: 57mL OMNIPAQUE IOHEXOL 300 MG/ML SOLN  COMPARISON: Head CT 07/16/2004  FINDINGS: Orbits: Subtle asymmetric right periorbital soft tissue swelling. The globes are normal in size and contour. The extraocular muscles are symmetric and unremarkable. Normal appearance of the optic nerve sheaths. No abnormal enhancement or stranding within the postseptal orbits.  Visualized sinuses: No significant paranasal sinus disease or mastoid effusion.  Soft tissues: Subtle asymmetric right periorbital soft tissue swelling.  Limited intracranial: No abnormality identified  IMPRESSION: Subtle asymmetric right periorbital swelling which may reflect preseptal orbital cellulitis.  No evidence of postseptal extension. No abscess.   Electronically Signed By: Jackey Loge DO On: 05/07/2019 18:48     Procedures Procedures (including critical care time)  Medications Ordered in ED Medications  tetracaine (PONTOCAINE) 0.5 % ophthalmic solution 2 drop (has no administration in time range)  fluorescein ophthalmic strip 1 strip (has no administration in time range)  HYDROcodone-acetaminophen (NORCO/VICODIN) 5-325 MG per tablet 1 tablet (1 tablet Oral Given 05/07/19 1645)  iohexol (OMNIPAQUE) 300 MG/ML solution 75 mL (75  mLs Intravenous Contrast Given 05/07/19 1804)    Initial Impression / Assessment and Plan / ED Course  I have reviewed the triage vital signs and the nursing notes.  Pertinent labs & imaging results that were available during my care of the patient were reviewed by me and considered in my medical decision making (see chart for details).  23 year old presents for evaluation of 5 days of eye erythema, pain and pruritus.  Clear discharge.  She is also noticed some erythema and some swelling surrounding her right eye.  Does admit to some mild pain with EOMs to her right eye.  She does not appear septic or ill.  She has not take anything for pain at her home.  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  States she has not followed up with ophthalmology recently.  Eye exam with: Mild swelling and erythema surrounding right upper and lower lip. No induration or fluctuance. Copious clear watery drainage to right eye. No cloudy corneal bilaterally. PERRLA. Mild pain with EOM to right eye. IOP right 14,15,14 left 16,16,15. Right conjunctival with injection.  Low suspicion for iritis, uveitis, acute angle glaucoma, keratitis.  She has no fluorescein uptake on stain exam.  She is a contact lens user.    CT orbits with preseptal cellulitis.   1940: Patient reevaluated pain improved. Discussed results with patient. Will treat for preseptal cellulitis and conjunctivitis. Discussed follow up with ophthalmology within the next 24 hours.  Patient voiced understanding is agreeable for follow-up.  No corneal abrasions, entrapment, consensual photophobia, or dendritic staining with fluorescein study.  Presentation non-concerning for iritis, corneal abrasions, acute angle glaucoma or HSV.  Personal hygiene and frequent handwashing discussed.   The patient has been appropriately medically screened and/or stabilized in the ED. I have low suspicion  for any other emergent medical condition which would require further screening, evaluation or treatment in the ED or require inpatient management.  Patient is hemodynamically stable and in no acute distress.  Patient able to ambulate in department prior to ED.  Evaluation does not  show acute pathology that would require ongoing or additional emergent interventions while in the emergency department or further inpatient treatment.  I have discussed the diagnosis with the patient and answered all questions.  Pain is been managed while in the emergency department and patient has no further complaints prior to discharge.  Patient is comfortable with plan discussed in room and is stable for discharge at this time.  I have discussed strict return precautions for returning to the emergency department.  Patient was encouraged to follow-up with PCP/specialist refer to at discharge.        Final Clinical Impressions(s) / ED Diagnoses   Final diagnoses:  Preseptal cellulitis of right eye  Acute conjunctivitis of right eye, unspecified acute conjunctivitis type    ED Discharge Orders         Ordered    sulfamethoxazole-trimethoprim (BACTRIM DS) 800-160 MG tablet  2 times daily     05/07/19 1940    ciprofloxacin (CILOXAN) 0.3 % ophthalmic ointment     05/07/19 1940           Jolanta Cabeza A, PA-C 05/07/19 1944    Vanetta MuldersZackowski, Scott, MD 05/13/19 [phone]

## 2019-05-07 NOTE — Discharge Instructions (Signed)
It is important that you follow-up with ophthalmology tomorrow morning.  Please use the drops and oral medications as prescribed.

## 2019-05-07 NOTE — ED Triage Notes (Signed)
Patient reports redness and irritation to her R eye that started 4 days ago.
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
## 2021-01-13 ENCOUNTER — Other Ambulatory Visit: Payer: Self-pay | Admitting: Women's Health

## 2021-01-21 ENCOUNTER — Emergency Department (HOSPITAL_COMMUNITY)
Admission: EM | Admit: 2021-01-21 | Discharge: 2021-01-21 | Disposition: A | Discharge status: Home / Self Care | Payer: Medicaid Other | Attending: Emergency Medicine | Admitting: Emergency Medicine

## 2021-01-21 ENCOUNTER — Other Ambulatory Visit: Payer: Self-pay

## 2021-01-21 ENCOUNTER — Encounter (HOSPITAL_COMMUNITY): Payer: Self-pay

## 2021-01-21 DIAGNOSIS — L089 Local infection of the skin and subcutaneous tissue, unspecified: Secondary | ICD-10-CM | POA: Insufficient documentation

## 2021-01-21 DIAGNOSIS — X58XXXA Exposure to other specified factors, initial encounter: Secondary | ICD-10-CM | POA: Insufficient documentation

## 2021-01-21 DIAGNOSIS — S90822A Blister (nonthermal), left foot, initial encounter: Secondary | ICD-10-CM | POA: Diagnosis not present

## 2021-01-21 DIAGNOSIS — Z5321 Procedure and treatment not carried out due to patient leaving prior to being seen by health care provider: Secondary | ICD-10-CM | POA: Insufficient documentation

## 2021-01-21 NOTE — ED Triage Notes (Signed)
Pt arrived via POV c/o of worsening infection on inferior left foot X 4 days. Pt reports walking outside barefoot and infection became worse. Redness, swelling and blisters present in Triage.

## 2021-01-21 NOTE — ED Notes (Signed)
Called for Pt X 2. No answer. Pt not answering cellphone.

## 2021-02-23 ENCOUNTER — Ambulatory Visit: Payer: Medicaid Other | Admitting: Adult Health

## 2021-07-04 ENCOUNTER — Ambulatory Visit: Payer: Medicaid Other | Admitting: Women's Health

## 2021-09-13 ENCOUNTER — Other Ambulatory Visit (HOSPITAL_COMMUNITY)
Admission: RE | Admit: 2021-09-13 | Discharge: 2021-09-13 | Disposition: A | Discharge status: Home / Self Care | Payer: Medicaid Other | Source: Ambulatory Visit | Attending: Adult Health | Admitting: Adult Health

## 2021-09-13 ENCOUNTER — Other Ambulatory Visit: Payer: Self-pay

## 2021-09-13 ENCOUNTER — Ambulatory Visit (INDEPENDENT_AMBULATORY_CARE_PROVIDER_SITE_OTHER): Payer: Medicaid Other | Admitting: Adult Health

## 2021-09-13 ENCOUNTER — Encounter: Payer: Self-pay | Admitting: Adult Health

## 2021-09-13 VITALS — BP 108/65 | HR 81 | Ht 63.0 in | Wt 180.0 lb

## 2021-09-13 DIAGNOSIS — R5383 Other fatigue: Secondary | ICD-10-CM

## 2021-09-13 DIAGNOSIS — Z124 Encounter for screening for malignant neoplasm of cervix: Secondary | ICD-10-CM | POA: Insufficient documentation

## 2021-09-13 DIAGNOSIS — Z3202 Encounter for pregnancy test, result negative: Secondary | ICD-10-CM | POA: Diagnosis not present

## 2021-09-13 DIAGNOSIS — N926 Irregular menstruation, unspecified: Secondary | ICD-10-CM | POA: Diagnosis not present

## 2021-09-13 DIAGNOSIS — N898 Other specified noninflammatory disorders of vagina: Secondary | ICD-10-CM | POA: Diagnosis not present

## 2021-09-13 DIAGNOSIS — Z113 Encounter for screening for infections with a predominantly sexual mode of transmission: Secondary | ICD-10-CM | POA: Insufficient documentation

## 2021-09-13 DIAGNOSIS — N911 Secondary amenorrhea: Secondary | ICD-10-CM | POA: Diagnosis not present

## 2021-09-13 DIAGNOSIS — A599 Trichomoniasis, unspecified: Secondary | ICD-10-CM | POA: Insufficient documentation

## 2021-09-13 DIAGNOSIS — Z1159 Encounter for screening for other viral diseases: Secondary | ICD-10-CM

## 2021-09-13 DIAGNOSIS — R635 Abnormal weight gain: Secondary | ICD-10-CM | POA: Insufficient documentation

## 2021-09-13 LAB — POCT WET PREP (WET MOUNT)
Clue Cells Wet Prep Whiff POC: POSITIVE
WBC, Wet Prep HPF POC: POSITIVE

## 2021-09-13 LAB — POCT URINE PREGNANCY: Preg Test, Ur: NEGATIVE

## 2021-09-13 MED ORDER — MEDROXYPROGESTERONE ACETATE 10 MG PO TABS: ORAL_TABLET | ORAL | 0 refills | Status: DC

## 2021-09-13 MED ORDER — METRONIDAZOLE 500 MG PO TABS: 500.0000 mg | ORAL_TABLET | Freq: Two times a day (BID) | ORAL | 0 refills | Status: DC

## 2021-09-13 NOTE — Progress Notes (Signed)
?Subjective:  ?  ? Patient ID: Patricia Vasquez, female   DOB: 05/26/1996, 26 y.o.   MRN: [id number] ? ?HPI ?[name] is a 26 year old white female,single, G2P1011, in complaining of no period since August 2022 and has gained weight.  She needs pap.  ?PCP is [facility]. ? ?Review of Systems ?No period since August ?+weigh gain ?Tired at times  ?Pain with sex, last time was 3 months ago ?Reviewed past medical,surgical, social and family history. Reviewed medications and allergies.  ?   ?Objective:  ? Physical Exam ?BP 108/65 (BP Location: Right Arm, Patient Position: Sitting, Cuff Size: Normal)   Pulse 81   Ht 5\' 3"  (1.6 m)   Wt 180 lb (81.6 kg)   LMP 02/07/2021 (Approximate)   BMI 31.89 kg/m?  UPT is negative. Skin warm and dry. Neck: mid line trachea, normal thyroid, good ROM, no lymphadenopathy noted. Lungs: clear to ausculation bilaterally. Cardiovascular: regular rate and rhythm.  ?  Pelvic: external genitalia is normal in appearance no lesions, vagina: greenish discharge with odor,urethra has no lesions or masses noted, cervix:smooth and bulbous, pap with GC/CHL performed,uterus: normal size, shape and contour, non tender, no masses felt, adnexa: no masses or tenderness noted. Bladder is non tender and no masses felt. Wet prep: + for clue cells and +trich, and +WBCs. ? [facility] - 09/13/21 1002   ? ?  ? Pregnancy Intention Screening  ? Does the patient want to become pregnant in the next year? Yes   ? Does the patient's partner want to become pregnant in the next year? Yes   ? Would the patient like to discuss contraceptive options today? No   ?  ? Contraception Wrap Up  ? Current Method Pregnant/Seeking Pregnancy   ? End Method Pregnant/Seeking Pregnancy   ? Contraception Counseling Provided No   ? ?  ?  ? ?  ? Examination chaperoned by 09/15/21 LPN ?Assessment:  ?   ?1. Routine cervical smear ?Pap sent ?- Cytology - PAP( [place]) ? ?2. Screen for STD (sexually transmitted disease) ?Pap sent with  GC/CHL ?- Cytology - PAP( [place]) ?Will check labs ?- HIV Antibody (routine testing w rflx) ?- RPR ?- Hepatitis C antibody ? ?3. Missed period ?- POCT urine pregnancy ? ?4. Secondary amenorrhea ?Will rx provera 10 mg for 10 days to see if can get withdrawal bleed ?- TSH ? ?5. Vaginal odor ? ?- POCT Wet Prep [facility]) ? ?6. Vaginal discharge ?- POCT Wet Prep [facility]) ? ?7. Trichimoniasis ?+wet prep, will rx flagyl 500 mg 1 bid x 7 days , no sex or alcohol during treatment  ?Tell partners needs to be treated too ?- POCT Wet Prep [facility]) ?Meds ordered this encounter  ?Medications  ? DISCONTD: metroNIDAZOLE (FLAGYL) 500 MG tablet  ?  Sig: Take 1 tablet (500 mg total) by mouth 2 (two) times daily.  ?  Dispense:  14 tablet  ?  Refill:  0  ?  Order Specific Question:   Supervising Provider  ?  Answer:   [place] H [[id number]]  ? medroxyPROGESTERone (PROVERA) 10 MG tablet  ?  Sig: Take 1 daily for 10 days  ?  Dispense:  10 tablet  ?  Refill:  0  ?  Order Specific Question:   Supervising Provider  ?  Answer:   Duane Lope H [[id number]]  ? metroNIDAZOLE (FLAGYL) 500 MG tablet  ?  Sig: Take 1 tablet (500 mg total) by mouth  2 (two) times daily.  ?  Dispense:  14 tablet  ?  Refill:  0  ?  Order Specific Question:   Supervising Provider  ?  Answer:   Duane Lope H [[id number]]  ?  ?8. Weight gain ?- TSH ?- Comprehensive metabolic panel ? ?9. Tired ? ?- TSH ?- CBC ? ?10. Need for hepatitis C screening test ?- Hepatitis C antibody  ?   ?Plan:  ?   ?Follow up in 3 weeks with me for ROS and POT  ?   ?

## 2021-09-14 LAB — COMPREHENSIVE METABOLIC PANEL
ALT: 36 IU/L — ABNORMAL HIGH (ref 0–32)
AST: 36 IU/L (ref 0–40)
Albumin/Globulin Ratio: 1.5 (ref 1.2–2.2)
Albumin: 4.6 g/dL (ref 3.9–5.0)
Alkaline Phosphatase: 111 IU/L (ref 44–121)
BUN/Creatinine Ratio: 13 (ref 9–23)
BUN: 11 mg/dL (ref 6–20)
Bilirubin Total: <0.2 mg/dL (ref 0.0–1.2)
CO2: 26 mmol/L (ref 20–29)
Calcium: 9.8 mg/dL (ref 8.7–10.2)
Chloride: 98 mmol/L (ref 96–106)
Creatinine, Ser: 0.84 mg/dL (ref 0.57–1.00)
Globulin, Total: 3.1 g/dL (ref 1.5–4.5)
Glucose: 84 mg/dL (ref 70–99)
Potassium: 4.5 mmol/L (ref 3.5–5.2)
Sodium: 139 mmol/L (ref 134–144)
Total Protein: 7.7 g/dL (ref 6.0–8.5)
eGFR: 99 mL/min/{1.73_m2} (ref >59)

## 2021-09-14 LAB — CBC
Hematocrit: 38.6 % (ref 34.0–46.6)
Hemoglobin: 12.6 g/dL (ref 11.1–15.9)
MCH: 29.9 pg (ref 26.6–33.0)
MCHC: 32.6 g/dL (ref 31.5–35.7)
MCV: 92 fL (ref 79–97)
Platelets: 395 10*3/uL (ref 150–450)
RBC: 4.21 x10E6/uL (ref 3.77–5.28)
RDW: 14.5 % (ref 11.7–15.4)
WBC: 9.1 10*3/uL (ref 3.4–10.8)

## 2021-09-14 LAB — RPR: RPR Ser Ql: NONREACTIVE

## 2021-09-14 LAB — CYTOLOGY - PAP
Chlamydia: NEGATIVE
Comment: NEGATIVE
Comment: NORMAL
Diagnosis: NEGATIVE
Neisseria Gonorrhea: NEGATIVE

## 2021-09-14 LAB — HEPATITIS C ANTIBODY: Hep C Virus Ab: NONREACTIVE

## 2021-09-14 LAB — TSH: TSH: 2.88 u[IU]/mL (ref 0.450–4.500)

## 2021-09-14 LAB — HIV ANTIBODY (ROUTINE TESTING W REFLEX): HIV Screen 4th Generation wRfx: NONREACTIVE

## 2021-09-15 ENCOUNTER — Telehealth: Payer: Self-pay | Admitting: Adult Health

## 2021-09-15 NOTE — Telephone Encounter (Signed)
Patient is asking if you would call her to talk another medication that she is taking and wants to know if it will interfere with want you script for her ?

## 2021-09-15 NOTE — Telephone Encounter (Signed)
Left message that I called, can call me in am or send mychart message ?

## 2021-10-04 ENCOUNTER — Ambulatory Visit: Payer: Medicaid Other | Admitting: Adult Health

## 2021-10-06 ENCOUNTER — Encounter: Payer: Self-pay | Admitting: Adult Health

## 2021-10-06 ENCOUNTER — Ambulatory Visit (INDEPENDENT_AMBULATORY_CARE_PROVIDER_SITE_OTHER): Payer: Medicaid Other | Admitting: Adult Health

## 2021-10-06 VITALS — BP 120/67 | HR 83 | Ht 63.0 in | Wt 178.0 lb

## 2021-10-06 DIAGNOSIS — N911 Secondary amenorrhea: Secondary | ICD-10-CM

## 2021-10-06 DIAGNOSIS — A599 Trichomoniasis, unspecified: Secondary | ICD-10-CM | POA: Diagnosis not present

## 2021-10-06 DIAGNOSIS — Z09 Encounter for follow-up examination after completed treatment for conditions other than malignant neoplasm: Secondary | ICD-10-CM | POA: Insufficient documentation

## 2021-10-06 LAB — POCT WET PREP (WET MOUNT): Trichomonas Wet Prep HPF POC: ABSENT

## 2021-10-06 NOTE — Progress Notes (Signed)
?  Subjective:  ?  ? Patient ID: Patricia Vasquez, female   DOB: 1995-11-08, 26 y.o.   MRN: [id number] ? ?HPI ?Selenna is a 26 year old white female, single, G2P1011, if for proof of treatment on taking flagyl for trich and to see if started period after provera. Period did start.  ?Lab Results  ?Component Value Date  ? DIAGPAP  09/13/2021  ?  - Negative for intraepithelial lesion or malignancy (NILM)  ? PCP is [facility] ? ?Review of Systems ?No sex since treatment ?Period started after provera ?Reviewed past medical,surgical, social and family history. Reviewed medications and allergies.  ?   ?Objective:  ? Physical Exam ?BP 120/67 (BP Location: Right Arm, Patient Position: Sitting, Cuff Size: Normal)   Pulse 83   Ht 5\' 3"  (1.6 m)   Wt 178 lb (80.7 kg)   LMP 09/27/2021 (Within Days)   BMI 31.53 kg/m?   ?  Skin warm and dry.Pelvic: external genitalia is normal in appearance no lesions, vagina: period blood, no odor,urethra has no lesions or masses noted, cervix:smooth and bulbous, uterus: normal size, shape and contour, non tender, no masses felt, adnexa: no masses or tenderness noted. Bladder is non tender and no masses felt. Wet prep: negative for trich ? Up[facility] - 10/06/21 1513   ? ?  ? Pregnancy Intention Screening  ? Does the patient want to become pregnant in the next year? Yes   ? Does the patient's partner want to become pregnant in the next year? Yes   ? Would the patient like to discuss contraceptive options today? No   ?  ? Contraception Wrap Up  ? Current Method Pregnant/Seeking Pregnancy   ? End Method Pregnant/Seeking Pregnancy   ? Contraception Counseling Provided No   ? ?  ?  ? ?  ? Examination chaperoned by Celene Squibb LPN ? ?Assessment:  ?   ?1. Follow-up exam after treatment ?No trich on wet prep ? ?2. Secondary amenorrhea ?Labs were normal ?Period started after provera ?Will follow for now, she is aware that don't want her to miss over 3 months in a row ? ?3. Trichimoniasis ?No sex since  treatment ?Wet prep was negative    ?Plan:  ?   ?Follow up prn  ?   ?

## 2022-06-08 ENCOUNTER — Encounter: Payer: Self-pay | Admitting: Emergency Medicine

## 2022-06-08 ENCOUNTER — Ambulatory Visit
Admission: EM | Admit: 2022-06-08 | Discharge: 2022-06-08 | Disposition: A | Discharge status: Home / Self Care | Payer: Medicaid Other

## 2022-06-08 DIAGNOSIS — R112 Nausea with vomiting, unspecified: Secondary | ICD-10-CM

## 2022-06-08 LAB — POCT URINALYSIS DIP (MANUAL ENTRY)
Bilirubin, UA: NEGATIVE
Blood, UA: NEGATIVE
Glucose, UA: NEGATIVE mg/dL
Ketones, POC UA: NEGATIVE mg/dL
Leukocytes, UA: NEGATIVE
Nitrite, UA: NEGATIVE
Protein Ur, POC: 30 mg/dL — AB
Spec Grav, UA: >=1.03 — AB (ref 1.010–1.025)
Urobilinogen, UA: 0.2 E.U./dL
pH, UA: 6 (ref 5.0–8.0)

## 2022-06-08 LAB — POCT URINE PREGNANCY: Preg Test, Ur: NEGATIVE

## 2022-06-08 MED ORDER — ONDANSETRON 4 MG PO TBDP
4.0000 mg | ORAL_TABLET | Freq: Three times a day (TID) | ORAL | 0 refills | Status: DC | PRN
Start: 2022-06-08 — End: 2023-01-18

## 2022-06-08 NOTE — ED Triage Notes (Signed)
Nausea and vomiting that started yesterday morning.  States she has had these episodes several times recently.  Was seen at the other urgent care in town and was told she had acid reflux and was given medication to take.  States she has not taken the reflux medication yet.  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

## 2022-06-08 NOTE — Discharge Instructions (Addendum)
The urine pregnancy test is negative.  Your urine shows that you need to increase intake to prevent dehydration.  You should be drinking at least 8-10 8 ounce glasses of water daily. Take medication as prescribed. Recommend a brat diet until nausea and vomiting improved.  This includes bananas, rice, applesauce, and toast. If your symptoms worsen and you are experiencing more than 7-10 episodes of nausea and vomiting with new symptoms of fever, chills, or abdominal pain, please go to the emergency department immediately. As discussed, please follow-up with the methadone clinic regarding the side effects of the medication that you are currently taking. Also recommend a trial of the medication you are previously prescribed for acid reflux. If symptoms fail to improve, please consider following up with gastroenterology for further evaluation.

## 2022-06-08 NOTE — ED Provider Notes (Signed)
[facility]    CSN: [id number] Arrival date & time: 06/08/22  1245      History   Chief Complaint No chief complaint on file.   HPI Patricia Vasquez is a 26 y.o. female.   The history is provided by the patient.   The patient presents for complaints of nausea and vomiting that started over the past 24 hours.  Patient states that she vomited for most of the day on yesterday.  She states that this has happened in the past, with her last episode approximately 2 months ago.  Patient denies fever, chills, chest pain, diarrhea, or abdominal pain.  She states that she does have abdominal tenderness because of the amount of times that she was vomiting.  Her last menstrual cycle she states was approximately 3 to 4 months ago.  She states that she has seen gynecology and was given medicine for her abnormal periods.  She states she has not followed up as they continue to be abnormal.  She also states that she has not had any gas, bloating, or urinary symptoms.  Patient states that she was previously prescribed medicine for acid reflux when she went to another urgent care.  She states that she has not started that medication.  She denies any known sick contacts, or new foods.  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  She states that her dose has not changed, nor has she used while on the methadone.  She states that she has spoken with the methadone clinic and they told her that her symptoms were most likely not related to the medication.  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   Missed period 09/13/2021   Screen for STD (sexually transmitted disease) 09/13/2021   Routine cervical smear 09/13/2021   Trichimoniasis 09/13/2021   Vaginal discharge 09/13/2021   Vaginal  odor 09/13/2021   Weight gain 09/13/2021   Postpartum depression 02/21/2018   Gallbladder sludge 10/01/2017   Drug use 09/06/2017   Scabies exposure 02/21/2017   Smoker 09/07/2016   Breast mass, right 11/05/2013    Past Surgical History:  Procedure Laterality Date   ADENOIDECTOMY     TONSILLECTOMY      OB History     Gravida  2   Para  1   Term  1   Preterm  0   AB  1   Living  1      SAB  1   IAB  0   Ectopic  0   Multiple  0   Live Births  1            Home Medications    Prior to Admission medications   Medication Sig Start Date End Date Taking? Authorizing Provider  methadone (DOLOPHINE) 10 MG tablet Take 80 mg by mouth daily.   Yes [provider]  ondansetron (ZOFRAN-ODT) 4 MG disintegrating tablet Take 1 tablet (4 mg total) by mouth every 8 (eight) hours as needed for nausea or vomiting. 06/08/22  Yes Raylynne Cubbage-Warren, Sadie Haber, NP    Family History Family History  Problem Relation Age of Onset   Hypertension Father    Cancer Maternal Grandfather        luekemia   Cancer Paternal Grandfather  lung    Social History Social History   Tobacco Use   Smoking status: Former    Packs/day: 1.00    Years: 6.00    Total pack years: 6.00    Types: Cigarettes   Smokeless tobacco: Never  Vaping Use   Vaping Use: Every day   Substances: Nicotine  Substance Use Topics   Alcohol use: Yes   Drug use: No     Allergies   Keflex [cephalexin]   Review of Systems Review of Systems Per HPI  Physical Exam Triage Vital Signs ED Triage Vitals [06/08/22 0836]  Enc Vitals Group     BP 128/71     Pulse Rate (!) 105     Resp 16     Temp 98.3 F (36.8 C)     Temp Source Oral     SpO2 98 %     Weight      Height      Head Circumference      Peak Flow      Pain Score 4     Pain Loc      Pain Edu?      Excl. in GC?    No data found.  Updated Vital Signs BP 128/71 (BP Location: Right Arm)   Pulse (!) 105   Temp  98.3 F (36.8 C) (Oral)   Resp 16   SpO2 98%   Visual Acuity Right Eye Distance:   Left Eye Distance:   Bilateral Distance:    Right Eye Near:   Left Eye Near:    Bilateral Near:     Physical Exam Vitals and nursing note reviewed.  Constitutional:      General: She is not in acute distress.    Appearance: Normal appearance.  HENT:     Head: Normocephalic.  Eyes:     Pupils: Pupils are equal, round, and reactive to light.  Cardiovascular:     Rate and Rhythm: Regular rhythm. Tachycardia present.     Pulses: Normal pulses.     Heart sounds: Normal heart sounds.  Pulmonary:     Effort: Pulmonary effort is normal. No respiratory distress.     Breath sounds: Normal breath sounds. No stridor. No wheezing, rhonchi or rales.  Abdominal:     General: Bowel sounds are normal.     Palpations: Abdomen is soft.     Tenderness: There is generalized abdominal tenderness.  Musculoskeletal:     Cervical back: Normal range of motion.  Lymphadenopathy:     Cervical: No cervical adenopathy.  Skin:    General: Skin is warm and dry.  Neurological:     General: No focal deficit present.     Mental Status: She is alert and oriented to person, place, and time.  Psychiatric:        Mood and Affect: Mood normal.        Behavior: Behavior normal.        Thought Content: Thought content normal.        Judgment: Judgment normal.      UC Treatments / Results  Labs (all labs ordered are listed, but only abnormal results are displayed) Labs Reviewed  POCT URINALYSIS DIP (MANUAL ENTRY) - Abnormal; Notable for the following components:      Result Value   Spec Grav, UA >=1.030 (*)    Protein Ur, POC =30 (*)    All other components within normal limits  POCT URINE PREGNANCY    EKG   Radiology No  results found.  Procedures Procedures (including critical care time)  Medications Ordered in UC Medications - No data to display  Initial Impression / Assessment and Plan / UC Course   I have reviewed the triage vital signs and the nursing notes.  Pertinent labs & imaging results that were available during my care of the patient were reviewed by me and considered in my medical decision making (see chart for details).  The patient is well-appearing, she is in no acute distress, vital signs show she is mildly tachycardic, but otherwise stable.  Patient presents with nausea and vomiting, which appears to be improving as she has not had any episodes today.  No concern for acute abdomen given the patient's current presentation, vital signs, and physical exam.  Discussion with patient that cannot discern if her symptoms are related to the methadone she is currently taking, or if there are other underlying gastrointestinal concerns.  Urine pregnancy test was negative, urinalysis did show significant gravity.  Ondansetron 4 mg was prescribed for patient's nausea and vomiting.  Recommended that she begin medications previously prescribed for reflux.  Patient was advised to follow-up with the methadone clinic for for further discussion regarding side effects of medication.  Patient was given strict indications of when to go to the emergency department.  Patient verbalizes understanding.  All questions were answered.  Patient stable for discharge.  Work note was provided. Final Clinical Impressions(s) / UC Diagnoses   Final diagnoses:  Nausea and vomiting, unspecified vomiting type     Discharge Instructions      The urine pregnancy test is negative.  Your urine shows that you need to increase intake to prevent dehydration.  You should be drinking at least 8-10 8 ounce glasses of water daily. Take medication as prescribed. Recommend a brat diet until nausea and vomiting improved.  This includes bananas, rice, applesauce, and toast. If your symptoms worsen and you are experiencing more than 7-10 episodes of nausea and vomiting with new symptoms of fever, chills, or abdominal pain, please  go to the emergency department immediately. As discussed, please follow-up with the methadone clinic regarding the side effects of the medication that you are currently taking. Also recommend a trial of the medication you are previously prescribed for acid reflux. If symptoms fail to improve, please consider following up with gastroenterology for further evaluation.     ED Prescriptions     Medication Sig Dispense Auth. Provider   ondansetron (ZOFRAN-ODT) 4 MG disintegrating tablet Take 1 tablet (4 mg total) by mouth every 8 (eight) hours as needed for nausea or vomiting. 20 tablet Sharese Manrique-Warren, Sadie Haber, NP      PDMP not reviewed this encounter.   Abran Cantor, NP 06/08/22 1025

## 2022-06-29 ENCOUNTER — Encounter: Payer: Self-pay | Admitting: Internal Medicine

## 2022-08-06 NOTE — Progress Notes (Deleted)
GI Office Note    Referring Provider: [facility], [facility]* Primary Care Physician:  [facility], [facility]  Primary Gastroenterologist: Cristopher Estimable.Rourk, MD  Chief Complaint   No chief complaint on file.  History of Present Illness   Patricia Vasquez is a 27 y.o. female presenting today at the request of [facility], [facility]* for ***nausea and vomiting.    Urgent care visit 06/08/22. Twenty four hours of N/V. Previous episode 2 months prior. Reported seeing GYN for abnormal periods . Reported went to previous urgent care and given acid reflux medication. On methadone for history of drug abuse. UPT negative. Advised BRAT diet. Encouraged patient to take previously prescribed acid reflux.   Today:       Current Outpatient Medications  Medication Sig Dispense Refill   methadone (DOLOPHINE) 10 MG tablet Take 80 mg by mouth daily.     ondansetron (ZOFRAN-ODT) 4 MG disintegrating tablet Take 1 tablet (4 mg total) by mouth every 8 (eight) hours as needed for nausea or vomiting. 20 tablet 0   No current facility-administered medications for this visit.    Past Medical History:  Diagnosis Date   Medical history non-contributory     Past Surgical History:  Procedure Laterality Date   ADENOIDECTOMY     TONSILLECTOMY      Family History  Problem Relation Age of Onset   Hypertension Father    Cancer Maternal Grandfather        luekemia   Cancer Paternal Grandfather        lung    Allergies as of 08/07/2022 - Review Complete 06/08/2022  Allergen Reaction Noted   Keflex [cephalexin] Rash 04/24/2017    Social History   Socioeconomic History   Marital status: Single    Spouse name: Not on file   Number of children: Not on file   Years of education: Not on file   Highest education level: Not on file  Occupational History   Not on file  Tobacco Use   Smoking status: Former    Packs/day: 1.00    Years: 6.00    Total pack years: 6.00    Types:  Cigarettes   Smokeless tobacco: Never  Vaping Use   Vaping Use: Every day   Substances: Nicotine  Substance and Sexual Activity   Alcohol use: Yes   Drug use: No   Sexual activity: Not Currently    Birth control/protection: None  Other Topics Concern   Not on file  Social History Narrative   Not on file   Social Determinants of Health   Financial Resource Strain: Not on file  Food Insecurity: Not on file  Transportation Needs: Not on file  Physical Activity: Not on file  Stress: Not on file  Social Connections: Not on file  Intimate Partner Violence: Not on file     Review of Systems   Gen: Denies any fever, chills, fatigue, weight loss, lack of appetite.  CV: Denies chest pain, heart palpitations, peripheral edema, syncope.  Resp: Denies shortness of breath at rest or with exertion. Denies wheezing or cough.  GI: see HPI GU : Denies urinary burning, urinary frequency, urinary hesitancy MS: Denies joint pain, muscle weakness, cramps, or limitation of movement.  Derm: Denies rash, itching, dry skin Psych: Denies depression, anxiety, memory loss, and confusion Heme: Denies bruising, bleeding, and enlarged lymph nodes.   Physical Exam   There were no vitals taken for this visit.  General:   Alert and oriented. Pleasant  and cooperative. Well-nourished and well-developed.  Head:  Normocephalic and atraumatic. Eyes:  Without icterus, sclera clear and conjunctiva pink.  Ears:  Normal auditory acuity. Mouth:  No deformity or lesions, oral mucosa pink.  Lungs:  Clear to auscultation bilaterally. No wheezes, rales, or rhonchi. No distress.  Heart:  S1, S2 present without murmurs appreciated.  Abdomen:  +BS, soft, non-tender and non-distended. No HSM noted. No guarding or rebound. No masses appreciated.  Rectal:  Deferred  Msk:  Symmetrical without gross deformities. Normal posture. Extremities:  Without edema. Neurologic:  Alert and  oriented x4;  grossly normal  neurologically. Skin:  Intact without significant lesions or rashes. Psych:  Alert and cooperative. Normal mood and affect.   Assessment   Patricia Vasquez is a 27 y.o. female with a history of drug/substance abuse *** presenting today with nausea and vomiting.      PLAN   ***    Venetia Night, MSN, FNP-BC, AGACNP-BC [facility]

## 2022-08-07 ENCOUNTER — Ambulatory Visit: Payer: Medicaid Other | Admitting: Gastroenterology

## 2022-08-30 NOTE — Progress Notes (Unsigned)
GI Office Note    Referring Provider: [facility], [facility]* Primary Care Physician:  [facility], [place]  Primary Gastroenterologist: Cristopher Estimable.Rourk, MD   Chief Complaint   No chief complaint on file.   History of Present Illness   Patricia Vasquez is a 27 y.o. female presenting today at the request of [facility], [facility] for ***nausea and vomiting.   Urgent care visit 06/08/22. Twenty four hours of N/V. Previous episode 2 months prior. Reported seeing GYN for abnormal periods . Reported went to previous urgent care and given acid reflux medication but did not start taking it. On methadone for history of drug abuse. UPT negative. Denied drug use while on methadone. No recent sick contacts or new foods. No gas, bloating, urinary symptoms. Given zofran while in urgent care. Mildly tachycardic on exam, otherwise vitals stable. Advised BRAT diet. Encouraged patient to take previously prescribed acid reflux medication   Today:        Current Outpatient Medications  Medication Sig Dispense Refill   methadone (DOLOPHINE) 10 MG tablet Take 80 mg by mouth daily.     ondansetron (ZOFRAN-ODT) 4 MG disintegrating tablet Take 1 tablet (4 mg total) by mouth every 8 (eight) hours as needed for nausea or vomiting. 20 tablet 0   No current facility-administered medications for this visit.    Past Medical History:  Diagnosis Date   Medical history non-contributory     Past Surgical History:  Procedure Laterality Date   ADENOIDECTOMY     TONSILLECTOMY      Family History  Problem Relation Age of Onset   Hypertension Father    Cancer Maternal Grandfather        luekemia   Cancer Paternal Grandfather        lung    Allergies as of 09/04/2022 - Review Complete 06/08/2022  Allergen Reaction Noted   Keflex [cephalexin] Rash 04/24/2017    Social History   Socioeconomic History   Marital status: Single    Spouse name: Not on file   Number of children:  Not on file   Years of education: Not on file   Highest education level: Not on file  Occupational History   Not on file  Tobacco Use   Smoking status: Former    Packs/day: 1.00    Years: 6.00    Total pack years: 6.00    Types: Cigarettes   Smokeless tobacco: Never  Vaping Use   Vaping Use: Every day   Substances: Nicotine  Substance and Sexual Activity   Alcohol use: Yes   Drug use: No   Sexual activity: Not Currently    Birth control/protection: None  Other Topics Concern   Not on file  Social History Narrative   Not on file   Social Determinants of Health   Financial Resource Strain: Not on file  Food Insecurity: Not on file  Transportation Needs: Not on file  Physical Activity: Not on file  Stress: Not on file  Social Connections: Not on file  Intimate Partner Violence: Not on file     Review of Systems  *** Gen: Denies any fever, chills, fatigue, weight loss, lack of appetite.  CV: Denies chest pain, heart palpitations, peripheral edema, syncope.  Resp: Denies shortness of breath at rest or with exertion. Denies wheezing or cough.  GI: see HPI GU : Denies urinary burning, urinary frequency, urinary hesitancy MS: Denies joint pain, muscle weakness, cramps, or limitation of movement.  Derm: Denies rash, itching,  dry skin Psych: Denies depression, anxiety, memory loss, and confusion Heme: Denies bruising, bleeding, and enlarged lymph nodes.   Physical Exam   There were no vitals taken for this visit.  General:   Alert and oriented. Pleasant and cooperative. Well-nourished and well-developed.  Head:  Normocephalic and atraumatic. Eyes:  Without icterus, sclera clear and conjunctiva pink.  Ears:  Normal auditory acuity. Mouth:  No deformity or lesions, oral mucosa pink.  Lungs:  Clear to auscultation bilaterally. No wheezes, rales, or rhonchi. No distress.  Heart:  S1, S2 present without murmurs appreciated.  Abdomen:  +BS, soft, non-tender and  non-distended. No HSM noted. No guarding or rebound. No masses appreciated.  Rectal:  Deferred *** Msk:  Symmetrical without gross deformities. Normal posture. Extremities:  Without edema. Neurologic:  Alert and  oriented x4;  grossly normal neurologically. Skin:  Intact without significant lesions or rashes. Psych:  Alert and cooperative. Normal mood and affect.   Assessment   Patricia Vasquez is a 27 y.o. female with a history of drug/substance abuse*** presenting today with nausea and vomiting  Nausea and vomiting:    PLAN   ***    Venetia Night, MSN, FNP-BC, AGACNP-BC [facility]

## 2022-09-04 ENCOUNTER — Encounter: Payer: Self-pay | Admitting: Gastroenterology

## 2022-09-04 ENCOUNTER — Ambulatory Visit: Payer: Medicaid Other | Admitting: Gastroenterology

## 2022-09-04 VITALS — BP 98/48 | HR 76 | Temp 97.9°F | Ht 63.0 in | Wt 170.0 lb

## 2022-09-04 DIAGNOSIS — K219 Gastro-esophageal reflux disease without esophagitis: Secondary | ICD-10-CM | POA: Diagnosis not present

## 2022-09-04 DIAGNOSIS — R112 Nausea with vomiting, unspecified: Secondary | ICD-10-CM | POA: Diagnosis not present

## 2022-09-04 NOTE — Patient Instructions (Addendum)
As we discussed is very likely that your nausea and vomiting is related more to acid reflux.  Continue taking the omeprazole 20 mg once daily, 30 minutes prior to breakfast.  Follow a GERD diet:  Avoid fried, fatty, greasy, spicy, citrus foods. Avoid caffeine and carbonated beverages. Avoid chocolate. Try eating 4-6 small meals a day rather than 3 large meals. Do not eat within 3 hours of laying down. Prop head of bed up on wood or bricks to create a 6 inch incline.  You may take famotidine (Pepcid) as needed for breakthrough.  I would encourage you to take this prior to eating meals that are heavily fried or large tomato-based meals.  Also lots of caffeine prior to bed could also be contributing to symptoms.  Also as we discussed smoking and vaping are high risk factors for uncontrolled reflux which could be contributing to your nausea and vomiting.  I would recommend reducing with goal of stopping in the near future.  To check your electrolytes I am ordering some basic blood work for you today.  You may have this performed at [facility] located at [employer]. across the street from [facility] emergency department.  It is located on the second floor.  It was a pleasure to see you today. I want to create trusting relationships with patients. If you receive a survey regarding your visit,  I greatly appreciate you taking time to fill this out on paper or through your MyChart. I value your feedback.  Venetia Night, MSN, FNP-BC, AGACNP-BC [facility] '

## 2022-09-05 ENCOUNTER — Encounter: Payer: Self-pay | Admitting: Gastroenterology

## 2022-09-05 LAB — COMPREHENSIVE METABOLIC PANEL
AG Ratio: 1.5 (calc) (ref 1.0–2.5)
ALT: 13 U/L (ref 6–29)
AST: 16 U/L (ref 10–30)
Albumin: 4.6 g/dL (ref 3.6–5.1)
Alkaline phosphatase (APISO): 69 U/L (ref 31–125)
BUN: 12 mg/dL (ref 7–25)
CO2: 28 mmol/L (ref 20–32)
Calcium: 9.7 mg/dL (ref 8.6–10.2)
Chloride: 101 mmol/L (ref 98–110)
Creat: 0.81 mg/dL (ref 0.50–0.96)
Globulin: 3 g/dL (calc) (ref 1.9–3.7)
Glucose, Bld: 87 mg/dL (ref 65–139)
Potassium: 4 mmol/L (ref 3.5–5.3)
Sodium: 139 mmol/L (ref 135–146)
Total Bilirubin: 0.3 mg/dL (ref 0.2–1.2)
Total Protein: 7.6 g/dL (ref 6.1–8.1)

## 2022-09-05 LAB — CBC
HCT: 37.4 % (ref 35.0–45.0)
Hemoglobin: 12.4 g/dL (ref 11.7–15.5)
MCH: 30.5 pg (ref 27.0–33.0)
MCHC: 33.2 g/dL (ref 32.0–36.0)
MCV: 91.9 fL (ref 80.0–100.0)
MPV: 10 fL (ref 7.5–12.5)
Platelets: 404 10*3/uL — ABNORMAL HIGH (ref 140–400)
RBC: 4.07 10*6/uL (ref 3.80–5.10)
RDW: 13 % (ref 11.0–15.0)
WBC: 7.9 10*3/uL (ref 3.8–10.8)

## 2022-09-07 ENCOUNTER — Encounter: Payer: Self-pay | Admitting: *Deleted

## 2022-10-12 ENCOUNTER — Telehealth: Payer: Self-pay | Admitting: *Deleted

## 2022-10-12 DIAGNOSIS — R109 Unspecified abdominal pain: Secondary | ICD-10-CM

## 2022-10-12 NOTE — Telephone Encounter (Signed)
Pt called and states that she has had nausea and vomiting for the last 2 days. She states she has missed work. No fever and regular BM's. Would like a work note for the last 2 days.

## 2022-10-13 ENCOUNTER — Encounter: Payer: Self-pay | Admitting: *Deleted

## 2022-10-13 NOTE — Telephone Encounter (Signed)
Noted  

## 2022-10-13 NOTE — Addendum Note (Signed)
Addended by: Armstead Peaks on: 10/13/2022 11:31 AM   Modules accepted: Orders

## 2022-10-13 NOTE — Telephone Encounter (Signed)
Spoke to pt, informed her of recommendations. Pt voiced understanding. Pt states she is still vaping. She does eat fried and fatty foods. She states she is trying to cut back. She states she has cut back on caffeine. The pain is upper and lower. She states she thinks she needs an EGD, because her symptoms are getting worse.

## 2022-10-13 NOTE — Telephone Encounter (Signed)
Spoke with pt. Scheduled EGD 4/29. Aware needs UPT prior. Advised when/wher eto go. Instructions sent via [employer]

## 2022-10-20 ENCOUNTER — Other Ambulatory Visit (HOSPITAL_COMMUNITY)
Admission: RE | Admit: 2022-10-20 | Discharge: 2022-10-20 | Disposition: A | Discharge status: Home / Self Care | Payer: Medicaid Other | Source: Ambulatory Visit | Attending: Internal Medicine | Admitting: Internal Medicine

## 2022-10-20 DIAGNOSIS — R109 Unspecified abdominal pain: Secondary | ICD-10-CM | POA: Diagnosis present

## 2022-10-20 LAB — PREGNANCY, URINE: Preg Test, Ur: NEGATIVE

## 2022-10-23 ENCOUNTER — Encounter (HOSPITAL_COMMUNITY): Payer: Self-pay | Admitting: Internal Medicine

## 2022-10-23 ENCOUNTER — Ambulatory Visit (HOSPITAL_COMMUNITY): Payer: Medicaid Other | Admitting: Anesthesiology

## 2022-10-23 ENCOUNTER — Encounter (HOSPITAL_COMMUNITY)
Admission: RE | Disposition: A | Discharge status: Home / Self Care | Payer: Self-pay | Source: Home / Self Care | Attending: Internal Medicine

## 2022-10-23 ENCOUNTER — Ambulatory Visit (HOSPITAL_COMMUNITY)
Admission: RE | Admit: 2022-10-23 | Discharge: 2022-10-23 | Disposition: A | Discharge status: Home / Self Care | Payer: Medicaid Other | Attending: Internal Medicine | Admitting: Internal Medicine

## 2022-10-23 ENCOUNTER — Ambulatory Visit (HOSPITAL_BASED_OUTPATIENT_CLINIC_OR_DEPARTMENT_OTHER): Payer: Medicaid Other | Admitting: Anesthesiology

## 2022-10-23 ENCOUNTER — Other Ambulatory Visit: Payer: Self-pay

## 2022-10-23 DIAGNOSIS — K2950 Unspecified chronic gastritis without bleeding: Secondary | ICD-10-CM

## 2022-10-23 DIAGNOSIS — K2970 Gastritis, unspecified, without bleeding: Secondary | ICD-10-CM

## 2022-10-23 DIAGNOSIS — R112 Nausea with vomiting, unspecified: Secondary | ICD-10-CM | POA: Diagnosis not present

## 2022-10-23 DIAGNOSIS — F17290 Nicotine dependence, other tobacco product, uncomplicated: Secondary | ICD-10-CM

## 2022-10-23 DIAGNOSIS — B9681 Helicobacter pylori [H. pylori] as the cause of diseases classified elsewhere: Secondary | ICD-10-CM | POA: Diagnosis not present

## 2022-10-23 DIAGNOSIS — Z87891 Personal history of nicotine dependence: Secondary | ICD-10-CM | POA: Insufficient documentation

## 2022-10-23 HISTORY — PX: BIOPSY: SHX5522

## 2022-10-23 HISTORY — PX: ESOPHAGOGASTRODUODENOSCOPY (EGD) WITH PROPOFOL: SHX5813

## 2022-10-23 SURGERY — ESOPHAGOGASTRODUODENOSCOPY (EGD) WITH PROPOFOL
Anesthesia: General

## 2022-10-23 MED ORDER — LIDOCAINE HCL 1 % IJ SOLN
INTRAMUSCULAR | Status: DC | PRN
  Administered 2022-10-23: 50 mg via INTRADERMAL

## 2022-10-23 MED ORDER — PROPOFOL 10 MG/ML IV BOLUS
INTRAVENOUS | Status: DC | PRN
  Administered 2022-10-23: 40 mg via INTRAVENOUS
  Administered 2022-10-23 (×2): 100 mg via INTRAVENOUS
  Administered 2022-10-23: 50 mg via INTRAVENOUS

## 2022-10-23 MED ORDER — LACTATED RINGERS IV SOLN: INTRAVENOUS | Status: DC

## 2022-10-23 NOTE — Anesthesia Postprocedure Evaluation (Signed)
Anesthesia Post Note  Patient: Patricia Vasquez  Procedure(s) Performed: ESOPHAGOGASTRODUODENOSCOPY (EGD) WITH PROPOFOL BIOPSY  Patient location during evaluation: Short Stay Anesthesia Type: General Level of consciousness: awake and alert Pain management: pain level controlled Vital Signs Assessment: post-procedure vital signs reviewed and stable Respiratory status: spontaneous breathing Cardiovascular status: blood pressure returned to baseline and stable Postop Assessment: no apparent nausea or vomiting Anesthetic complications: no   No notable events documented.   Last Vitals:  Vitals:   10/23/22 1307  BP: 113/71  Pulse: (!) 103  Resp: 15  Temp: 37.1 C  SpO2: 98%    Last Pain:  Vitals:   10/23/22 1430  TempSrc:   PainSc: 0-No pain                 Tex Conroy

## 2022-10-23 NOTE — Op Note (Signed)
[facility] Patient Name: Patricia Vasquez Procedure Date: 10/23/2022 1:59 PM MRN: [id number] Date of Birth: [birth year] Attending MD: Gennette Pac , MD, [id number] CSN: [id number] Age: 27 Admit Type: Outpatient Procedure:                Upper GI endoscopy Indications:              Nausea with vomiting Providers:                Gennette Pac, MD, Crystal Page, Cyril Mourning, Technician Referring MD:              Medicines:                Monitored Anesthesia Care Complications:            No immediate complications. Estimated Blood Loss:     Estimated blood loss was minimal. Procedure:                Pre-Anesthesia Assessment:                           - Prior to the procedure, a History and Physical                            was performed, and patient medications and                            allergies were reviewed. The patient's tolerance of                            previous anesthesia was also reviewed. The risks                            and benefits of the procedure and the sedation                            options and risks were discussed with the patient.                            All questions were answered, and informed consent                            was obtained. Prior Anticoagulants: The patient has                            taken no anticoagulant or antiplatelet agents. ASA                            Grade Assessment: II - A patient with mild systemic                            disease. After reviewing the risks and benefits,  the patient was deemed in satisfactory condition to                            undergo the procedure.                           After obtaining informed consent, the endoscope was                            passed under direct vision. Throughout the                            procedure, the patient's blood pressure, pulse, and                            oxygen  saturations were monitored continuously. The                            [id number]-[id number] ([id number]) scope was introduced through the                            mouth, and advanced to the second part of duodenum.                            The upper GI endoscopy was accomplished without                            difficulty. The patient tolerated the procedure                            well. Scope In: 2:34:32 PM Scope Out: 2:40:52 PM Total Procedure Duration: 0 hours 6 minutes 20 seconds  Findings:      The examined esophagus was normal. Minimal retained gastric contents       (last intake of solid food reportedly 20 hours prior to this procedure)       stomach mottling appearance with some maceration of the mucosa. No ulcer       or infiltrating process. Pylorus patent. Examination of bulb and second       portion revealed some hemorrhagic changes involving the bulbar and D2       mucosa. Again food fragments in this segment of the GI tract.      Biopsies of the abnormal gastric mucosa and duodenum taken for       histologic study Impression:               - Normal esophagus. Retained gastric contents.                            Inflamed appearing stomach and duodenum of                            uncertain significance status post gastric and                            duodenal biopsy                           -  Patient likely has an element of cannabinoid                            hyperemesis syndrome with her frequent cannabis use                            for nausea and vomiting.                           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 The following parameters were monitored: oxygen       saturation, heart rate, blood pressure, respiratory rate, EKG, adequacy       of pulmonary ventilation, and response to  care. Recommendation:           - Patient has a contact number available for                            emergencies. The signs and symptoms of potential                            delayed complications were discussed with the                            patient. Return to normal activities tomorrow.                            Written discharge instructions were provided to the                            patient.                           - Advance diet as tolerated.                           - Continue present medications. Refrain from                            cannabis use. Follow-up on pathology. Office                            follow-up with [place] in 6 weeks. Procedure Code(s):        --- Professional ---                           [phone], Esophagogastroduodenoscopy, flexible,                            transoral; diagnostic, including collection of  specimen(s) by brushing or washing, when performed                            (separate procedure) Diagnosis Code(s):        --- Professional ---                           R11.2, Nausea with vomiting, unspecified CPT copyright 2022 American Medical Association. All rights reserved. The codes documented in this report are preliminary and upon coder review may  be revised to meet current compliance requirements. Gerrit Friends. Fabion Gatson, MD Gennette Pac, MD 10/23/2022 3:03:10 PM This report has been signed electronically. Number of Addenda: 0

## 2022-10-23 NOTE — Anesthesia Preprocedure Evaluation (Signed)
Anesthesia Evaluation  Patient identified by MRN, date of birth, ID band Patient awake    Reviewed: Allergy & Precautions, H&P , NPO status , Patient's Chart, lab work & pertinent test results, reviewed documented beta blocker date and time   Airway Mallampati: II  TM Distance: >3 FB Neck ROM: full    Dental no notable dental hx.    Pulmonary neg pulmonary ROS, former smoker   Pulmonary exam normal breath sounds clear to auscultation       Cardiovascular Exercise Tolerance: Good negative cardio ROS  Rhythm:regular Rate:Normal     Neuro/Psych  PSYCHIATRIC DISORDERS  Depression    negative neurological ROS  negative psych ROS   GI/Hepatic negative GI ROS, Neg liver ROS,,,  Endo/Other  negative endocrine ROS    Renal/GU negative Renal ROS  negative genitourinary   Musculoskeletal   Abdominal   Peds  Hematology negative hematology ROS (+)   Anesthesia Other Findings   Reproductive/Obstetrics negative OB ROS                             Anesthesia Physical Anesthesia Plan  ASA: 2  Anesthesia Plan: General   Post-op Pain Management:    Induction:   PONV Risk Score and Plan: Propofol infusion  Airway Management Planned:   Additional Equipment:   Intra-op Plan:   Post-operative Plan:   Informed Consent: I have reviewed the patients History and Physical, chart, labs and discussed the procedure including the risks, benefits and alternatives for the proposed anesthesia with the patient or authorized representative who has indicated his/her understanding and acceptance.     Dental Advisory Given  Plan Discussed with: CRNA  Anesthesia Plan Comments:        Anesthesia Quick Evaluation

## 2022-10-23 NOTE — Transfer of Care (Signed)
Immediate Anesthesia Transfer of Care Note  Patient: Patricia Vasquez  Procedure(s) Performed: ESOPHAGOGASTRODUODENOSCOPY (EGD) WITH PROPOFOL BIOPSY  Patient Location: Endoscopy Unit  Anesthesia Type:General  Level of Consciousness: awake  Airway & Oxygen Therapy: Patient Spontanous Breathing  Post-op Assessment: Report given to RN  Post vital signs: Reviewed and stable  Last Vitals:  Vitals Value Taken Time  BP    Temp    Pulse    Resp    SpO2      Last Pain:  Vitals:   10/23/22 1430  TempSrc:   PainSc: 0-No pain         Complications: No notable events documented.

## 2022-10-23 NOTE — H&P (Signed)
@LOGO @   Primary Care Physician:  Tylene Fantasia., PA-C Primary Gastroenterologist:  Dr. Jena Gauss  Pre-Procedure History & Physical: HPI:  Patricia Vasquez is a 27 y.o. female here for  refractory nausea and vomiting.  Smokes cannabis regularly to combat the nausea and the vomiting.  Denies dysphagia.  Past Medical History:  Diagnosis Date   Medical history non-contributory     Past Surgical History:  Procedure Laterality Date   ADENOIDECTOMY     TONSILLECTOMY      Prior to Admission medications   Medication Sig Start Date End Date Taking? Authorizing Provider  methadone (DOLOPHINE) 10 MG/ML solution Take 80 mg by mouth daily.   Yes [provider]  omeprazole (PRILOSEC) 20 MG capsule Take 40 mg by mouth daily. 06/01/22  Yes [provider]  ondansetron (ZOFRAN-ODT) 4 MG disintegrating tablet Take 1 tablet (4 mg total) by mouth every 8 (eight) hours as needed for nausea or vomiting. 06/08/22  Yes Leath-Warren, Sadie Haber, NP    Allergies as of 10/13/2022 - Review Complete 09/04/2022  Allergen Reaction Noted   Keflex [cephalexin] Rash 04/24/2017    Family History  Problem Relation Age of Onset   Hypertension Father    Cancer Maternal Grandfather        luekemia   Cancer Paternal Grandfather        lung    Social History   Socioeconomic History   Marital status: Single    Spouse name: Not on file   Number of children: Not on file   Years of education: Not on file   Highest education level: Not on file  Occupational History   Not on file  Tobacco Use   Smoking status: Former    Packs/day: 1.00    Years: 6.00    Additional pack years: 0.00    Total pack years: 6.00    Types: Cigarettes   Smokeless tobacco: Never  Vaping Use   Vaping Use: Every day   Substances: Nicotine  Substance and Sexual Activity   Alcohol use: Yes   Drug use: No   Sexual activity: Not Currently    Birth control/protection: None  Other Topics Concern   Not on file   Social History Narrative   Not on file   Social Determinants of Health   Financial Resource Strain: Not on file  Food Insecurity: Not on file  Transportation Needs: Not on file  Physical Activity: Not on file  Stress: Not on file  Social Connections: Not on file  Intimate Partner Violence: Not on file    Review of Systems: See HPI, otherwise negative ROS  Physical Exam: BP 113/71   Pulse (!) 103   Temp 98.7 F (37.1 C) (Oral)   Resp 15   Ht 5\' 2"  (1.575 m)   Wt 76.7 kg   SpO2 98%   BMI 30.91 kg/m  General:   Alert,  Well-developed, well-nourished, pleasant and cooperative in NAD Mouth:  No deformity or lesions. Neck:  Supple; no masses or thyromegaly. No significant cervical adenopathy. Lungs:  Clear throughout to auscultation.   No wheezes, crackles, or rhonchi. No acute distress. Heart:  Regular rate and rhythm; no murmurs, clicks, rubs,  or gallops. Abdomen: Non-distended, normal bowel sounds.  Soft and nontender without appreciable mass or hepatosplenomegaly.   Impression/Plan:    Nausea and vomiting regular cannabis use. .  Further evaluation via EGD todayThe risks, benefits, limitations, alternatives and imponderables have been reviewed with the patient. Potential for esophageal  dilation, biopsy, etc. have also been reviewed.  Questions have been answered. All parties agreeable.      Notice: This dictation was prepared with Dragon dictation along with smaller phrase technology. Any transcriptional errors that result from this process are unintentional and may not be corrected upon review.

## 2022-10-23 NOTE — Discharge Instructions (Addendum)
EGD Discharge instructions Please read the instructions outlined below and refer to this sheet in the next few weeks. These discharge instructions provide you with general information on caring for yourself after you leave the hospital. Your doctor may also give you specific instructions. While your treatment has been planned according to the most current medical practices available, unavoidable complications occasionally occur. If you have any problems or questions after discharge, please call your doctor. ACTIVITY You may resume your regular activity but move at a slower pace for the next 24 hours.  Take frequent rest periods for the next 24 hours.  Walking will help expel (get rid of) the air and reduce the bloated feeling in your abdomen.  No driving for 24 hours (because of the anesthesia (medicine) used during the test).  You may shower.  Do not sign any important legal documents or operate any machinery for 24 hours (because of the anesthesia used during the test).  NUTRITION Drink plenty of fluids.  You may resume your normal diet.  Begin with a light meal and progress to your normal diet.  Avoid alcoholic beverages for 24 hours or as instructed by your caregiver.  MEDICATIONS You may resume your normal medications unless your caregiver tells you otherwise.  WHAT YOU CAN EXPECT TODAY You may experience abdominal discomfort such as a feeling of fullness or "gas" pains.  FOLLOW-UP Your doctor will discuss the results of your test with you.  SEEK IMMEDIATE MEDICAL ATTENTION IF ANY OF THE FOLLOWING OCCUR: Excessive nausea (feeling sick to your stomach) and/or vomiting.  Severe abdominal pain and distention (swelling).  Trouble swallowing.  Temperature over 101 F (37.8 C).  Rectal bleeding or vomiting of blood.        Your stomach and small intestine were inflamed.  Biopsies taken.    Findings are nonspecific.    I strongly recommend you stop using cannabis as this is likely  making   Your symptoms worse.      Further recommendations to follow pending review of pathology report      office visit with [place] in 8 weeks (Courtney Mahon)     [facility] FOR APPOINTMENT       at patient request I called Sabrena Gavitt, mother, at [phone] rolled to voicemail.  I left a message.

## 2022-10-24 ENCOUNTER — Emergency Department (HOSPITAL_COMMUNITY)
Admission: EM | Admit: 2022-10-24 | Discharge: 2022-10-24 | Disposition: A | Discharge status: Home / Self Care | Payer: Medicaid Other | Attending: Emergency Medicine | Admitting: Emergency Medicine

## 2022-10-24 ENCOUNTER — Emergency Department (HOSPITAL_COMMUNITY)
Admission: EM | Admit: 2022-10-24 | Discharge: 2022-10-24 | Discharge status: Against Medical Advice | Payer: Medicaid Other

## 2022-10-24 ENCOUNTER — Other Ambulatory Visit: Payer: Self-pay

## 2022-10-24 ENCOUNTER — Encounter (HOSPITAL_COMMUNITY): Payer: Self-pay

## 2022-10-24 DIAGNOSIS — G8929 Other chronic pain: Secondary | ICD-10-CM | POA: Insufficient documentation

## 2022-10-24 DIAGNOSIS — M545 Low back pain, unspecified: Secondary | ICD-10-CM | POA: Diagnosis present

## 2022-10-24 HISTORY — DX: Unspecified asthma, uncomplicated: J45.909

## 2022-10-24 LAB — URINALYSIS, ROUTINE W REFLEX MICROSCOPIC
Bilirubin Urine: NEGATIVE
Glucose, UA: NEGATIVE mg/dL
Hgb urine dipstick: NEGATIVE
Ketones, ur: NEGATIVE mg/dL
Nitrite: NEGATIVE
Protein, ur: NEGATIVE mg/dL
Specific Gravity, Urine: 1.02 (ref 1.005–1.030)
pH: 6 (ref 5.0–8.0)

## 2022-10-24 LAB — PREGNANCY, URINE: Preg Test, Ur: NEGATIVE

## 2022-10-24 MED ORDER — METHYLPREDNISOLONE 4 MG PO TBPK: ORAL_TABLET | ORAL | 0 refills | Status: DC

## 2022-10-24 NOTE — ED Notes (Signed)
Called patient 3 times with no answer, attempted to locate patient in bathroom and outside with no success.

## 2022-10-24 NOTE — ED Triage Notes (Signed)
Pt comes in with complaints of lower back pain that has been reoccurring since her pregnancy in 2019. Pt states that she can normally take tylenol to ease the pain but is not getting any relief with tylenol. Pt states unable to get comfortable.

## 2022-10-24 NOTE — ED Provider Notes (Signed)
[place] EMERGENCY DEPARTMENT AT [facility] Provider Note   CSN: [id number] Arrival date & time: 10/24/22  1620     History  Chief Complaint  Patient presents with   Back Pain    Patricia Vasquez is a 27 y.o. female.  Patient presents to the emergency department complaining of low back pain.  Patient states pain has existed since 2019 after she gave birth to her child that she has never had evaluated.  She endorses worsening back pain over the past few days.  She denies any injury or trauma to the area.  She denies any radiation of pain down the legs.  Denies urinary incontinence, fecal incontinence, urinary retention, saddle anesthesia.  Patient states she normally takes Tylenol for the pain but that for the past few days it does not seem to help.  Patient was at the hospital yesterday for an outpatient EGD.  She states she was found to have inflammatory changes and it was recommended she avoid anti-inflammatory medications.  Past medical history significant for tobacco use, alcohol use, asthma  HPI     Home Medications Prior to Admission medications   Medication Sig Start Date End Date Taking? Authorizing Provider  methylPREDNISolone (MEDROL DOSEPAK) 4 MG TBPK tablet Take as directed per package instructions 10/24/22  Yes Darrick Grinder, PA-C  methadone (DOLOPHINE) 10 MG/ML solution Take 80 mg by mouth daily.    [provider]  omeprazole (PRILOSEC) 20 MG capsule Take 40 mg by mouth daily. 06/01/22   [provider]  ondansetron (ZOFRAN-ODT) 4 MG disintegrating tablet Take 1 tablet (4 mg total) by mouth every 8 (eight) hours as needed for nausea or vomiting. 06/08/22   Leath-Warren, Sadie Haber, NP      Allergies    Keflex [cephalexin]    Review of Systems   Review of Systems  Physical Exam Updated Vital Signs BP 138/79 (BP Location: Right Arm)   Pulse 82   Temp 98.4 F (36.9 C) (Oral)   Resp 16   Ht 5\' 2"  (1.575 m)   Wt 76.6 kg   SpO2  95%   BMI 30.89 kg/m  Physical Exam Vitals and nursing note reviewed.  Constitutional:      General: She is not in acute distress.    Appearance: She is well-developed.  HENT:     Head: Normocephalic and atraumatic.  Eyes:     Conjunctiva/sclera: Conjunctivae normal.  Cardiovascular:     Rate and Rhythm: Normal rate and regular rhythm.     Heart sounds: No murmur heard. Pulmonary:     Effort: Pulmonary effort is normal. No respiratory distress.     Breath sounds: Normal breath sounds.  Abdominal:     Palpations: Abdomen is soft.     Tenderness: There is no abdominal tenderness.  Musculoskeletal:        General: Tenderness present. No swelling.     Cervical back: Neck supple.     Comments: Bilateral paraspinal muscle tenderness in the lower thoracic/upper lumbar region.  No midline spinal tenderness.  Skin:    General: Skin is warm and dry.     Capillary Refill: Capillary refill takes less than 2 seconds.  Neurological:     Mental Status: She is alert and oriented to person, place, and time.     Sensory: No sensory deficit.     Motor: No weakness.     Gait: Gait normal.  Psychiatric:        Mood and Affect:  Mood normal.     ED Results / Procedures / Treatments   Labs (all labs ordered are listed, but only abnormal results are displayed) Labs Reviewed  URINALYSIS, ROUTINE W REFLEX MICROSCOPIC - Abnormal; Notable for the following components:      Result Value   Leukocytes,Ua TRACE (*)    Bacteria, UA RARE (*)    All other components within normal limits  PREGNANCY, URINE    EKG None  Radiology No results found.  Procedures Procedures    Medications Ordered in ED Medications - No data to display  ED Course/ Medical Decision Making/ A&P                             Medical Decision Making Amount and/or Complexity of Data Reviewed Labs: ordered.   Patient presents the emergency room with chief complaint of back pain.  Differential diagnosis includes  but is not limited to fracture, dislocation, soft tissue injury, nephrolithiasis, and others  I ordered and reviewed labs.  Pertinent results include negative pregnancy test  Patient has no midline spinal tenderness and no red flag symptoms.  I see no indication at this time for spinal imaging.  Patient's pain seems musculoskeletal in nature.  Patient has no weakness and no sensory deficits.  Her gait is normal.  This pain is been intermittently ongoing for the past 5 years.  Question if positioning during the endoscopy yesterday may have aggravated some of her underlying back pain.  Plan to discharge home with short course of steroids and recommendation for continuation of Tylenol.  Patient states she will establish care with a primary care provider for further evaluation of her chronic back pain.  Return precautions provided.  Discharged home.       Final Clinical Impression(s) / ED Diagnoses Final diagnoses:  Chronic bilateral low back pain without sciatica    Rx / DC Orders ED Discharge Orders          Ordered    methylPREDNISolone (MEDROL DOSEPAK) 4 MG TBPK tablet        10/24/22 1806              Pamala Duffel 10/24/22 1807    Eber Hong, MD 10/26/22 [phone]

## 2022-10-24 NOTE — Discharge Instructions (Signed)
You were evaluated today for back pain.  I have prescribed a steroid Dosepak for you to take.  I also recommend continue take over-the-counter Tylenol.  Please follow-up with primary care for further evaluation and management as needed.  If you develop any life-threatening symptoms such as becoming unable to urinate please return to the emergency department.

## 2022-10-25 ENCOUNTER — Ambulatory Visit: Payer: Medicaid Other | Admitting: Adult Health

## 2022-10-26 ENCOUNTER — Encounter: Payer: Self-pay | Admitting: Adult Health

## 2022-10-26 ENCOUNTER — Other Ambulatory Visit (HOSPITAL_COMMUNITY)
Admission: RE | Admit: 2022-10-26 | Discharge: 2022-10-26 | Disposition: A | Discharge status: Home / Self Care | Payer: Medicaid Other | Source: Ambulatory Visit | Attending: Adult Health | Admitting: Adult Health

## 2022-10-26 ENCOUNTER — Ambulatory Visit (INDEPENDENT_AMBULATORY_CARE_PROVIDER_SITE_OTHER): Payer: Medicaid Other | Admitting: Adult Health

## 2022-10-26 VITALS — BP 117/73 | HR 108 | Ht 63.0 in | Wt 165.5 lb

## 2022-10-26 DIAGNOSIS — M545 Low back pain, unspecified: Secondary | ICD-10-CM | POA: Insufficient documentation

## 2022-10-26 DIAGNOSIS — N390 Urinary tract infection, site not specified: Secondary | ICD-10-CM | POA: Insufficient documentation

## 2022-10-26 DIAGNOSIS — N898 Other specified noninflammatory disorders of vagina: Secondary | ICD-10-CM | POA: Insufficient documentation

## 2022-10-26 DIAGNOSIS — N911 Secondary amenorrhea: Secondary | ICD-10-CM

## 2022-10-26 DIAGNOSIS — Z3202 Encounter for pregnancy test, result negative: Secondary | ICD-10-CM | POA: Diagnosis not present

## 2022-10-26 LAB — POCT URINALYSIS DIPSTICK
Blood, UA: NEGATIVE
Glucose, UA: NEGATIVE
Ketones, UA: NEGATIVE
Leukocytes, UA: NEGATIVE
Nitrite, UA: POSITIVE
Protein, UA: POSITIVE — AB

## 2022-10-26 LAB — SURGICAL PATHOLOGY

## 2022-10-26 LAB — POCT URINE PREGNANCY: Preg Test, Ur: NEGATIVE

## 2022-10-26 MED ORDER — NITROFURANTOIN MONOHYD MACRO 100 MG PO CAPS: 100.0000 mg | ORAL_CAPSULE | Freq: Two times a day (BID) | ORAL | 0 refills | Status: DC

## 2022-10-26 MED ORDER — MEDROXYPROGESTERONE ACETATE 10 MG PO TABS: ORAL_TABLET | ORAL | 0 refills | Status: DC

## 2022-10-26 NOTE — Progress Notes (Addendum)
Subjective:     Patient ID: Patricia Vasquez, female   DOB: 1996/03/17, 27 y.o.   MRN: [id number]  HPI Patricia Vasquez is a 27 year old white female, single, G2P1011, in complaining of no period in almost a year and low back pain for 3 days. She has been seen at ER [facility] on 10/24/22 and Urgent Care.  Was given steroids and did not take and also shot of Toradol and flexeril.  Last pap  was negative 09/13/21.  Review of Systems No period in almost a year Back pain x 3 days Urine has odor Did burn when voided  No sex in over a year she says Reviewed past medical,surgical, social and family history. Reviewed medications and allergies.     Objective:   Physical Exam BP 117/73 (BP Location: Left Arm, Patient Position: Sitting, Cuff Size: Normal)   Pulse (!) 108   Ht 5\' 3"  (1.6 m)   Wt 165 lb 8 oz (75.1 kg)   LMP  (LMP Unknown)   BMI 29.32 kg/m  Urine dipstick +nitrates. UPT is negative  Skin warm and dry.Pelvic: external genitalia is normal in appearance no lesions, vagina: white discharge without odor,urethra has no lesions or masses noted, cervix:smooth and bulbous, uterus: normal size, shape and contour, non tender, no masses felt, adnexa: no masses or tenderness noted. Bladder is non tender and no masses felt. CV swab obtained. Has low back tenderness and mild left CVAT.  [facility] - 10/26/22 1607       Pregnancy Intention Screening   Does the patient want to become pregnant in the next year? Ok Either Way    Does the patient's partner want to become pregnant in the next year? Ok Either Way    Would the patient like to discuss contraceptive options today? No      Contraception Wrap Up   Current Method Female Condom;Abstinence    End Method Female Condom            Examination chaperoned by Malachy Mood LPN     Assessment:     1. Pregnancy examination or test, negative result - POCT urine pregnancy  2. Acute low back pain, unspecified back pain laterality,  unspecified whether sciatica present Back pain for 3 days  Can use heat and ice  UA C&S sent  - POCT Urinalysis Dipstick - Urine Culture - Urinalysis, Routine w reflex microscopic  3. Secondary amenorrhea No period in almost a year UPT negative  Will rx provera 10 mg 1 daily for 10 days to see if gets withdrawal bleed  4. Urinary tract infection without hematuria, site unspecified +nitrates in urine  Will rx macrobid   Meds ordered this encounter  Medications   nitrofurantoin, macrocrystal-monohydrate, (MACROBID) 100 MG capsule    Sig: Take 1 capsule (100 mg total) by mouth 2 (two) times daily.    Dispense:  14 capsule    Refill:  0    Order Specific Question:   Supervising Provider    Answer:   Duane Lope H [[id number]]   medroxyPROGESTERone (PROVERA) 10 MG tablet    Sig: Take 1 daily for 10 days    Dispense:  10 tablet    Refill:  0    Order Specific Question:   Supervising Provider    Answer:   Despina Hidden, LUTHER H [[id number]]     5. Vaginal discharge CV swab sent for GC/CHL, tirch,BV and yeast  - Cervicovaginal ancillary only( [place])  Plan:     Follow up in 3 weeks for ROS and see if period starts

## 2022-10-27 ENCOUNTER — Ambulatory Visit
Admission: EM | Admit: 2022-10-27 | Discharge: 2022-10-27 | Disposition: A | Discharge status: Home / Self Care | Payer: Medicaid Other | Attending: Physician Assistant | Admitting: Physician Assistant

## 2022-10-27 ENCOUNTER — Encounter (HOSPITAL_COMMUNITY): Payer: Self-pay | Admitting: Internal Medicine

## 2022-10-27 ENCOUNTER — Ambulatory Visit (INDEPENDENT_AMBULATORY_CARE_PROVIDER_SITE_OTHER): Payer: Medicaid Other

## 2022-10-27 DIAGNOSIS — M5441 Lumbago with sciatica, right side: Secondary | ICD-10-CM

## 2022-10-27 DIAGNOSIS — M5442 Lumbago with sciatica, left side: Secondary | ICD-10-CM | POA: Insufficient documentation

## 2022-10-27 DIAGNOSIS — N3000 Acute cystitis without hematuria: Secondary | ICD-10-CM | POA: Insufficient documentation

## 2022-10-27 LAB — POCT URINE PREGNANCY: Preg Test, Ur: NEGATIVE

## 2022-10-27 LAB — URINALYSIS, ROUTINE W REFLEX MICROSCOPIC
Bilirubin, UA: NEGATIVE
Glucose, UA: NEGATIVE
Ketones, UA: NEGATIVE
Nitrite, UA: POSITIVE — AB
RBC, UA: NEGATIVE
Specific Gravity, UA: 1.029 (ref 1.005–1.030)
Urobilinogen, Ur: 1 mg/dL (ref 0.2–1.0)
pH, UA: 6.5 (ref 5.0–7.5)

## 2022-10-27 LAB — MICROSCOPIC EXAMINATION
Casts: NONE SEEN /lpf
Epithelial Cells (non renal): >10 /hpf — AB (ref 0–10)

## 2022-10-27 LAB — POCT URINALYSIS DIP (MANUAL ENTRY)
Bilirubin, UA: NEGATIVE
Blood, UA: NEGATIVE
Glucose, UA: NEGATIVE mg/dL
Nitrite, UA: POSITIVE — AB
Spec Grav, UA: >=1.03 — AB (ref 1.010–1.025)
Urobilinogen, UA: 2 E.U./dL — AB
pH, UA: 7 (ref 5.0–8.0)

## 2022-10-27 MED ORDER — LIDOCAINE 5 % EX PTCH: 1.0000 | MEDICATED_PATCH | CUTANEOUS | 0 refills | Status: DC

## 2022-10-27 MED ORDER — METHOCARBAMOL 500 MG PO TABS: 500.0000 mg | ORAL_TABLET | Freq: Two times a day (BID) | ORAL | 0 refills | Status: DC

## 2022-10-27 NOTE — ED Provider Notes (Signed)
[facility]    CSN: [id number] Arrival date & time: 10/27/22  1812      History   Chief Complaint Chief Complaint  Patient presents with   Back Pain    HPI Patricia Vasquez is a 27 y.o. female.   Patient presents today with a weeklong history of lower back pain.  She reports pain is rated 10 on a 0-10 pain scale, described as throbbing, no aggravating or alleviating factors identified.  She was initially seen in the emergency room which when she was started on methylprednisolone.  She did not start this medication.  She was seen again and prescribed cyclobenzaprine as well as ibuprofen.  She has been hesitant to take ibuprofen or methylprednisolone because she was diagnosed with H. pylori by her gastroenterologist.  She has not yet started the medication for this treatment but is concerned about upsetting her stomach with these medicines.  She does report that the cyclobenzaprine provides some relief of symptoms.  She denies any history of malignancy.  Denies recent trauma.  She denies any bowel/bladder incontinence, lower extremity weakness, saddle anesthesia.  She did see her OB/GYN who was concerned she has urinary tract infection.  Review of chart shows that yesterday she had 2 different UAs 1 of which had positive nitrites and leuks and the other suggested protein.  Microscopic analysis showed many bacteria and squamous cells.  She was prescribed Macrobid but has not yet started this medication.  She has not had any imaging of her back.    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   Missed period 09/13/2021   Screen for STD (sexually transmitted  disease) 09/13/2021   Routine cervical smear 09/13/2021   Trichimoniasis 09/13/2021   Vaginal discharge 09/13/2021   Vaginal odor 09/13/2021   Weight gain 09/13/2021   Postpartum depression 02/21/2018   Gallbladder sludge 10/01/2017   Drug use 09/06/2017   Scabies exposure 02/21/2017   Smoker 09/07/2016   Breast mass, right 11/05/2013    Past Surgical History:  Procedure Laterality Date   ADENOIDECTOMY     BIOPSY  10/23/2022   Procedure: BIOPSY;  Surgeon: Patricia Ade, MD;  Location: [facility];  Service: Endoscopy;;   ESOPHAGOGASTRODUODENOSCOPY (EGD) WITH PROPOFOL N/A 10/23/2022   Procedure: ESOPHAGOGASTRODUODENOSCOPY (EGD) WITH PROPOFOL;  Surgeon: Patricia Ade, MD;  Location: [facility];  Service: Endoscopy;  Laterality: N/A;  230PM, ASA 2   TONSILLECTOMY     UPPER GI ENDOSCOPY      OB History     Gravida  2   Para  1   Term  1   Preterm  0   AB  1   Living  1      SAB  1   IAB  0   Ectopic  0   Multiple  0   Live Births  1            Home Medications    Prior to Admission medications   Medication Sig Start Date End Date Taking? Authorizing Provider  lidocaine (LIDODERM) 5 % Place 1 patch onto the skin daily. Remove & Discard patch within 12 hours  or as directed by MD 10/27/22  Yes Patricia Vasquez, Patricia Retort, PA-C  methocarbamol (ROBAXIN) 500 MG tablet Take 1 tablet (500 mg total) by mouth 2 (two) times daily. 10/27/22  Yes Mallory Enriques K, PA-C  ibuprofen (ADVIL) 600 MG tablet Take by mouth. 10/24/22   [provider]  medroxyPROGESTERone (PROVERA) 10 MG tablet Take 1 daily for 10 days 10/26/22   Patricia Potter, NP  methadone (DOLOPHINE) 10 MG/ML solution Take 80 mg by mouth daily.    [provider]  nitrofurantoin, macrocrystal-monohydrate, (MACROBID) 100 MG capsule Take 1 capsule (100 mg total) by mouth 2 (two) times daily. 10/26/22   Patricia Potter, NP  omeprazole (PRILOSEC) 20 MG capsule Take 40 mg by mouth daily. 06/01/22    [provider]  ondansetron (ZOFRAN-ODT) 4 MG disintegrating tablet Take 1 tablet (4 mg total) by mouth every 8 (eight) hours as needed for nausea or vomiting. 06/08/22   Patricia Vasquez, Patricia Haber, NP    Family History Family History  Problem Relation Age of Onset   Hypertension Father    Cancer Maternal Grandfather        luekemia   Cancer Paternal Grandfather        lung    Social History Social History   Tobacco Use   Smoking status: Every Day    Types: E-cigarettes   Smokeless tobacco: Never  Vaping Use   Vaping Use: Every day   Substances: Nicotine  Substance Use Topics   Alcohol use: Not Currently   Drug use: Not Currently     Allergies   Keflex [cephalexin]   Review of Systems Review of Systems  Constitutional:  Positive for activity change. Negative for appetite change, fatigue and fever.  Gastrointestinal:  Negative for abdominal pain, diarrhea, nausea and vomiting.  Genitourinary:  Negative for dysuria, frequency, urgency, vaginal bleeding, vaginal discharge and vaginal pain.  Musculoskeletal:  Positive for back pain. Negative for arthralgias and myalgias.  Neurological:  Negative for weakness and numbness.     Physical Exam Triage Vital Signs ED Triage Vitals  Enc Vitals Group     BP 10/27/22 1855 115/69     Pulse Rate 10/27/22 1855 (!) 109     Resp 10/27/22 1855 16     Temp 10/27/22 1855 99.3 F (37.4 C)     Temp Source 10/27/22 1855 Oral     SpO2 10/27/22 1855 96 %     Weight --      Height --      Head Circumference --      Peak Flow --      Pain Score 10/27/22 1900 10     Pain Loc --      Pain Edu? --      Excl. in GC? --    No data found.  Updated Vital Signs BP 115/69 (BP Location: Right Arm)   Pulse (!) 109   Temp 99.3 F (37.4 C) (Oral)   Resp 16   LMP  (LMP Unknown) Comment: Pt was seing by OB/GYN on 10/26/2022 as she has not had a period in 1 yearl.  SpO2 96%   Visual Acuity Right Eye Distance:   Left Eye  Distance:   Bilateral Distance:    Right Eye Near:   Left Eye Near:    Bilateral Near:     Physical Exam Vitals reviewed.  Constitutional:      General: She is awake. She is not in acute distress.    Appearance: Normal  appearance. She is well-developed. She is not ill-appearing.     Comments: Very pleasant female appears stated age in no acute distress sitting comfortably in exam room  HENT:     Head: Normocephalic and atraumatic.  Cardiovascular:     Rate and Rhythm: Regular rhythm. Tachycardia present.     Heart sounds: Normal heart sounds, S1 normal and S2 normal. No murmur heard. Pulmonary:     Effort: Pulmonary effort is normal.     Breath sounds: Normal breath sounds. No wheezing, rhonchi or rales.     Comments: Clear to auscultation bilaterally Abdominal:     General: Bowel sounds are normal.     Palpations: Abdomen is soft.     Tenderness: There is no abdominal tenderness. There is no right CVA tenderness, left CVA tenderness, guarding or rebound.     Comments: Benign abdominal exam  Musculoskeletal:     Cervical back: No tenderness or bony tenderness.     Thoracic back: No tenderness or bony tenderness.     Lumbar back: Tenderness and bony tenderness present. Negative right straight leg raise test and negative left straight leg raise test.     Right lower leg: No edema.     Left lower leg: No edema.     Comments: Tender to palpation of lumbar paraspinal muscles bilaterally.  No spasm noted.  Pain with percussion of lumbar vertebrae without deformity or step-off.  Strength 5/5 bilateral upper and lower extremities.  Psychiatric:        Behavior: Behavior is cooperative.      UC Treatments / Results  Labs (all labs ordered are listed, but only abnormal results are displayed) Labs Reviewed  POCT URINALYSIS DIP (MANUAL ENTRY) - Abnormal; Notable for the following components:      Result Value   Color, UA orange (*)    Clarity, UA cloudy (*)    Ketones, POC UA  trace (5) (*)    Spec Grav, UA >=1.030 (*)    Protein Ur, POC trace (*)    Urobilinogen, UA 2.0 (*)    Nitrite, UA Positive (*)    Leukocytes, UA Trace (*)    All other components within normal limits  URINE CULTURE  POCT URINE PREGNANCY    EKG   Radiology DG Lumbar Spine Complete  Result Date: 10/27/2022 CLINICAL DATA:  Low back pain, radiculopathy EXAM: LUMBAR SPINE - COMPLETE 4+ VIEW COMPARISON:  09/28/2007 FINDINGS: Frontal, bilateral oblique, lateral views of the lumbar spine are obtained on 5 images. There are 5 non-rib-bearing lumbar type vertebral bodies in normal anatomic alignment. No acute displaced fractures. Disc spaces are well preserved. Sacroiliac joints are normal. IMPRESSION: 1. Unremarkable lumbar spine. Electronically Signed   By: Sharlet Salina M.D.   On: 10/27/2022 19:34    Procedures Procedures (including critical care time)  Medications Ordered in UC Medications - No data to display  Initial Impression / Assessment and Plan / UC Course  I have reviewed the triage vital signs and the nursing notes.  Pertinent labs & imaging results that were available during my care of the patient were reviewed by me and considered in my medical decision making (see chart for details).     Patient is mildly tachycardic but she attributes this to discomfort and pain.  She is otherwise well-appearing, afebrile, nontoxic.  UA was obtained that showed evidence of infection and we discussed that this could be contributing to her symptoms.  Recommended that she begin the nitrofurantoin that was previously  prescribed by OB/GYN as well as push fluids.  X-ray was obtained that showed no osseous abnormality.  She is hesitant to take NSAIDs given her recent diagnosis of H. pylori and so we will stick to muscle relaxers to help manage her symptoms.  She reports some improvement but not resolution with cyclobenzaprine so we will switch to methocarbamol in the hopes this will provide  additional pain relief.  She was started on lidocaine patches for additional symptom relief.  We discussed that she can use Tylenol as needed for additional symptom relief.  Discussed that if she has any worsening or changing symptoms including increasing pain, fever, nausea/vomiting she needs to be seen immediately.  Strict return precautions given.  Work excuse note provided.  Final Clinical Impressions(s) / UC Diagnoses   Final diagnoses:  Acute bilateral low back pain with bilateral sciatica  Acute cystitis without hematuria     Discharge Instructions      I believe that your back pain is related to a urinary tract infection.  Please start the nitrofurantoin that was prescribed by your OB/GYN.  Stop the cyclobenzaprine and start methocarbamol to provide some additional pain relief.  This will make you sleepy so do not drive or drink alcohol with taking it.  Apply lidocaine patch to specific areas that are bothersome.  Leave this on during the day and then remove it at night.  You should only use the patch for 12 hours and remove it for a minimum of 12 hours; only use 1 patch per 24 hours.  If you have any worsening or changing symptoms including worsening pain, lower extremity weakness, numbness on the inside of your legs, fever, nausea/vomiting you need to be seen immediately.     ED Prescriptions     Medication Sig Dispense Auth. Provider   lidocaine (LIDODERM) 5 % Place 1 patch onto the skin daily. Remove & Discard patch within 12 hours or as directed by MD 30 patch Patricia Vasquez K, PA-C   methocarbamol (ROBAXIN) 500 MG tablet Take 1 tablet (500 mg total) by mouth 2 (two) times daily. 20 tablet Patricia Vasquez, Patricia Retort, PA-C      PDMP not reviewed this encounter.   Patricia Hawking, PA-C 10/27/22 2008

## 2022-10-27 NOTE — ED Triage Notes (Signed)
Pt reports low back pain radiates to upper legs x 2 days.  Pt reports she had 2 visit this week for the back pain. Pt taking cyclobenzaprine gives some relief.   Pt wants to know if she can take Macrobid and Provera, prescribed by her OB/GYN yesterday, as she has H. pylori.

## 2022-10-27 NOTE — Discharge Instructions (Signed)
I believe that your back pain is related to a urinary tract infection.  Please start the nitrofurantoin that was prescribed by your OB/GYN.  Stop the cyclobenzaprine and start methocarbamol to provide some additional pain relief.  This will make you sleepy so do not drive or drink alcohol with taking it.  Apply lidocaine patch to specific areas that are bothersome.  Leave this on during the day and then remove it at night.  You should only use the patch for 12 hours and remove it for a minimum of 12 hours; only use 1 patch per 24 hours.  If you have any worsening or changing symptoms including worsening pain, lower extremity weakness, numbness on the inside of your legs, fever, nausea/vomiting you need to be seen immediately.

## 2022-10-28 LAB — URINE CULTURE

## 2022-10-30 ENCOUNTER — Other Ambulatory Visit: Payer: Self-pay

## 2022-10-30 ENCOUNTER — Other Ambulatory Visit: Payer: Self-pay | Admitting: Adult Health

## 2022-10-30 LAB — URINE CULTURE: Culture: >=100000 — AB

## 2022-10-30 LAB — CERVICOVAGINAL ANCILLARY ONLY
Bacterial Vaginitis (gardnerella): POSITIVE — AB
Candida Glabrata: NEGATIVE
Candida Vaginitis: NEGATIVE
Chlamydia: NEGATIVE
Comment: NEGATIVE
Comment: NEGATIVE
Comment: NEGATIVE
Comment: NEGATIVE
Comment: NEGATIVE
Comment: NORMAL
Neisseria Gonorrhea: NEGATIVE
Trichomonas: NEGATIVE

## 2022-10-30 MED ORDER — METRONIDAZOLE 500 MG PO TABS: 500.0000 mg | ORAL_TABLET | Freq: Two times a day (BID) | ORAL | 0 refills | Status: DC

## 2022-10-30 MED ORDER — BISMUTH/METRONIDAZ/TETRACYCLIN 140-125-125 MG PO CAPS: 3.0000 | ORAL_CAPSULE | Freq: Four times a day (QID) | ORAL | 0 refills | Status: DC

## 2022-10-30 NOTE — Progress Notes (Signed)
+  BV on vaginal swab will rx flagyl,no sex or alcohol while taking  ?

## 2022-10-31 ENCOUNTER — Ambulatory Visit: Payer: Medicaid Other | Admitting: Internal Medicine

## 2022-11-08 ENCOUNTER — Telehealth: Payer: Self-pay | Admitting: *Deleted

## 2022-11-08 NOTE — Telephone Encounter (Signed)
Pt called and stated that she is getting nauseated and having hot flashes. She states she has not been taking zofran. Informed to take zofran when she starts to feel nauseated to see if it will help. Please advise. You can send her a MyChart message.

## 2022-11-09 ENCOUNTER — Encounter: Payer: Self-pay | Admitting: *Deleted

## 2022-11-09 NOTE — Telephone Encounter (Signed)
Sent a MyChart message .

## 2022-11-13 ENCOUNTER — Other Ambulatory Visit: Payer: Self-pay | Admitting: Gastroenterology

## 2022-11-13 ENCOUNTER — Ambulatory Visit
Admission: EM | Admit: 2022-11-13 | Discharge: 2022-11-13 | Disposition: A | Discharge status: Home / Self Care | Payer: Medicaid Other | Attending: Nurse Practitioner | Admitting: Nurse Practitioner

## 2022-11-13 DIAGNOSIS — J069 Acute upper respiratory infection, unspecified: Secondary | ICD-10-CM | POA: Diagnosis present

## 2022-11-13 DIAGNOSIS — R5383 Other fatigue: Secondary | ICD-10-CM | POA: Diagnosis present

## 2022-11-13 LAB — POCT URINALYSIS DIP (MANUAL ENTRY)
Bilirubin, UA: NEGATIVE
Glucose, UA: NEGATIVE mg/dL
Ketones, POC UA: NEGATIVE mg/dL
Nitrite, UA: NEGATIVE
Protein Ur, POC: NEGATIVE mg/dL
Spec Grav, UA: 1.02 (ref 1.010–1.025)
Urobilinogen, UA: 0.2 E.U./dL
pH, UA: 7 (ref 5.0–8.0)

## 2022-11-13 LAB — POCT RAPID STREP A (OFFICE): Rapid Strep A Screen: NEGATIVE

## 2022-11-13 MED ORDER — FLUTICASONE PROPIONATE 50 MCG/ACT NA SUSP: 2.0000 | Freq: Every day | NASAL | 0 refills | Status: DC

## 2022-11-13 MED ORDER — CETIRIZINE HCL 10 MG PO TABS: 10.0000 mg | ORAL_TABLET | Freq: Every day | ORAL | 0 refills | Status: DC

## 2022-11-13 MED ORDER — METOCLOPRAMIDE HCL 5 MG PO TABS: 5.0000 mg | ORAL_TABLET | Freq: Three times a day (TID) | ORAL | 0 refills | Status: DC | PRN

## 2022-11-13 MED ORDER — PSEUDOEPH-BROMPHEN-DM 30-2-10 MG/5ML PO SYRP: 5.0000 mL | ORAL_SOLUTION | Freq: Four times a day (QID) | ORAL | 0 refills | Status: DC | PRN

## 2022-11-13 NOTE — Discharge Instructions (Addendum)
Take medication as prescribed. Increase fluids and allow for plenty of rest. May take over-the-counter Tylenol or ibuprofen as needed for pain, fever, general discomfort. Warm salt water gargles 3-4 times daily while symptoms persist. May use normal saline nasal spray throughout the day to help with nasal congestion and runny nose. Recommend using a humidifier in your bedroom at nighttime during sleep and sleeping elevated on pillows while cough symptoms persist. If symptoms do not improve over the next 7 to 10 days, please follow-up in this clinic for further evaluation.  Your urine culture did not indicate an obvious urinary tract infection.  A urine culture has been ordered.  If the results of the culture are positive, you will be contacted and provided treatment.  Follow-up as needed.

## 2022-11-13 NOTE — ED Provider Notes (Signed)
[facility]    CSN: [id number] Arrival date & time: 11/13/22  1724      History   Chief Complaint No chief complaint on file.   HPI Patricia Vasquez is a 27 y.o. female.   The history is provided by the patient.   Patient presents for complaints of sore throat, nasal congestion, and cough.  Symptoms have been present for 1 week.  Patient denies fever, headache, wheezing, shortness of breath, difficulty breathing, abdominal pain, nausea, vomiting, or diarrhea.  Patient reports she has not been taking any medication for her symptoms.  Patient states she has not felt well overall.  Patient states that she was seen in this clinic on 10/27/2022 and diagnosed with urinary tract infection.  She states subsequently, she followed up with gastroenterology and was treated for H. pylori.  Patient states she was advised to stop the medication for the UTI that she was taking while taking the medication for H. pylori.  Patient states she only took about 2 to 3 days of the medication for her UTI.    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   Missed period 09/13/2021   Screen for STD (sexually transmitted disease) 09/13/2021   Routine cervical smear 09/13/2021   Trichimoniasis 09/13/2021   Vaginal discharge 09/13/2021   Vaginal odor 09/13/2021   Weight gain 09/13/2021   Postpartum depression 02/21/2018   Gallbladder sludge 10/01/2017   Drug use 09/06/2017   Scabies exposure 02/21/2017   Smoker 09/07/2016   Breast mass, right 11/05/2013    Past Surgical History:  Procedure Laterality Date   ADENOIDECTOMY     BIOPSY  10/23/2022   Procedure: BIOPSY;   Surgeon: Corbin Ade, MD;  [facility];  Service: Endoscopy;;   ESOPHAGOGASTRODUODENOSCOPY (EGD) WITH PROPOFOL N/A 10/23/2022   Procedure: ESOPHAGOGASTRODUODENOSCOPY (EGD) WITH PROPOFOL;  Surgeon: Corbin Ade, MD;  Location[facility];  Service: Endoscopy;  Laterality: N/A;  230PM, ASA 2   TONSILLECTOMY     UPPER GI ENDOSCOPY      OB History     Gravida  2   Para  1   Term  1   Preterm  0   AB  1   Living  1      SAB  1   IAB  0   Ectopic  0   Multiple  0   Live Births  1            Home Medications    Prior to Admission medications   Medication Sig Start Date End Date Taking? Authorizing Provider  Bismuth/Metronidaz/Tetracyclin (PYLERA) [phone] MG CAPS Take 3 capsules by mouth 4 (four) times daily for 10 days. 10/30/22 11/09/22  Corbin Ade, MD  brompheniramine-pseudoephedrine-DM 30-2-10 MG/5ML syrup Take 5 mLs by mouth 4 (four) times daily as needed. 11/13/22  Yes Raushanah Osmundson-Warren, Sadie Haber, NP  cetirizine (ZYRTEC) 10 MG tablet Take 1 tablet (10 mg total) by mouth daily. 11/13/22  Yes Farron Watrous-Warren, Sadie Haber, NP  fluticasone (FLONASE) 50 MCG/ACT nasal spray Place 2 sprays into both nostrils daily. 11/13/22  Yes Malyah Ohlrich-Warren, Sadie Haber, NP  ibuprofen (ADVIL) 600 MG  tablet Take by mouth. 10/24/22   [provider]  lidocaine (LIDODERM) 5 % Place 1 patch onto the skin daily. Remove & Discard patch within 12 hours or as directed by MD 10/27/22   Raspet, Noberto Retort, PA-C  medroxyPROGESTERone (PROVERA) 10 MG tablet Take 1 daily for 10 days 10/26/22   Adline Potter, NP  methadone (DOLOPHINE) 10 MG/ML solution Take 80 mg by mouth daily.    [provider]  methocarbamol (ROBAXIN) 500 MG tablet Take 1 tablet (500 mg total) by mouth 2 (two) times daily. 10/27/22   Raspet, Noberto Retort, PA-C  metoCLOPramide (REGLAN) 5 MG tablet Take 1 tablet (5 mg total) by mouth every 8 (eight) hours as needed for nausea. 11/13/22   Aida Raider, NP   metroNIDAZOLE (FLAGYL) 500 MG tablet Take 1 tablet (500 mg total) by mouth 2 (two) times daily. 10/30/22   Adline Potter, NP  nitrofurantoin, macrocrystal-monohydrate, (MACROBID) 100 MG capsule Take 1 capsule (100 mg total) by mouth 2 (two) times daily. 10/26/22   Adline Potter, NP  omeprazole (PRILOSEC) 20 MG capsule Take 40 mg by mouth daily. 06/01/22   [provider]  ondansetron (ZOFRAN-ODT) 4 MG disintegrating tablet Take 1 tablet (4 mg total) by mouth every 8 (eight) hours as needed for nausea or vomiting. 06/08/22   Mekiah Wahler-Warren, Sadie Haber, NP    Family History Family History  Problem Relation Age of Onset   Hypertension Father    Cancer Maternal Grandfather        luekemia   Cancer Paternal Grandfather        lung    Social History Social History   Tobacco Use   Smoking status: Every Day    Types: E-cigarettes   Smokeless tobacco: Never  Vaping Use   Vaping Use: Every day   Substances: Nicotine  Substance Use Topics   Alcohol use: Not Currently   Drug use: Not Currently     Allergies   Keflex [cephalexin]   Review of Systems Review of Systems Per HPI  Physical Exam Triage Vital Signs ED Triage Vitals [11/13/22 1834]  Enc Vitals Group     BP (!) 144/83     Pulse Rate (!) 113     Resp 17     Temp 98.8 F (37.1 C)     Temp Source Oral     SpO2 98 %     Weight      Height      Head Circumference      Peak Flow      Pain Score 5     Pain Loc      Pain Edu?      Excl. in GC?    No data found.  Updated Vital Signs BP (!) 144/83 (BP Location: Right Arm)   Pulse (!) 113   Temp 98.8 F (37.1 C) (Oral)   Resp 17   LMP  (LMP Unknown) Comment: Pt was seing by OB/GYN on 10/26/2022 as she has not had a period in 1 yearl.  SpO2 98%   Visual Acuity Right Eye Distance:   Left Eye Distance:   Bilateral Distance:    Right Eye Near:   Left Eye Near:    Bilateral Near:     Physical Exam Vitals and nursing note reviewed.   Constitutional:      General: She is not in acute distress.    Appearance: Normal appearance.  HENT:     Head: Normocephalic.  Right Ear: Tympanic membrane, ear canal and external ear normal.     Left Ear: Tympanic membrane, ear canal and external ear normal.     Nose: Congestion present.     Mouth/Throat:     Mouth: Mucous membranes are moist.     Pharynx: Posterior oropharyngeal erythema present.     Comments: Cobblestoning present on posterior oropharynx Eyes:     Extraocular Movements: Extraocular movements intact.     Conjunctiva/sclera: Conjunctivae normal.     Pupils: Pupils are equal, round, and reactive to light.  Cardiovascular:     Rate and Rhythm: Regular rhythm. Tachycardia present.     Pulses: Normal pulses.     Heart sounds: Normal heart sounds.  Pulmonary:     Effort: Pulmonary effort is normal. No respiratory distress.     Breath sounds: Normal breath sounds. No stridor. No wheezing, rhonchi or rales.  Abdominal:     General: Bowel sounds are normal.     Palpations: Abdomen is soft.     Tenderness: There is no abdominal tenderness. There is no right CVA tenderness or left CVA tenderness.  Musculoskeletal:     Cervical back: Normal range of motion.  Lymphadenopathy:     Cervical: No cervical adenopathy.  Skin:    General: Skin is warm and dry.  Neurological:     General: No focal deficit present.     Mental Status: She is alert and oriented to person, place, and time.  Psychiatric:        Mood and Affect: Mood normal.        Behavior: Behavior normal.      UC Treatments / Results  Labs (all labs ordered are listed, but only abnormal results are displayed) Labs Reviewed  POCT URINALYSIS DIP (MANUAL ENTRY) - Abnormal; Notable for the following components:      Result Value   Clarity, UA hazy (*)    Blood, UA trace-intact (*)    Leukocytes, UA Trace (*)    All other components within normal limits  URINE CULTURE  CULTURE, GROUP A STREP [facility])   POCT RAPID STREP A (OFFICE)    EKG   Radiology No results found.  Procedures Procedures (including critical care time)  Medications Ordered in UC Medications - No data to display  Initial Impression / Assessment and Plan / UC Course  I have reviewed the triage vital signs and the nursing notes.  Pertinent labs & imaging results that were available during my care of the patient were reviewed by me and considered in my medical decision making (see chart for details).  The patient is well-appearing, she is in no acute distress, vital signs are stable.  Repeat urinalysis was negative for an obvious urinary tract infection, urine culture is pending.  Rapid strep test was also negative, a throat culture is also pending.  Further viral testing is not indicated as it will not impact the treatment plan. Upper respiratory symptoms most likely of viral etiology.  Will treat with Bromfed-DM for the cough, cetirizine 10 mg to help with nasal congestion, and fluticasone 50 mcg spray to help with nasal congestion and runny nose.  Supportive care recommendations were provided and discussed with the patient to include over-the-counter analgesics, fluids, and rest.  Patient was given indications of when follow-up may be necessary.  Patient is in agreement with this plan of care and verbalizes understanding.  All questions were answered.  Patient stable for discharge.   Final Clinical Impressions(s) / UC Diagnoses  Final diagnoses:  Other fatigue  Viral upper respiratory tract infection with cough     Discharge Instructions      Take medication as prescribed. Increase fluids and allow for plenty of rest. May take over-the-counter Tylenol or ibuprofen as needed for pain, fever, general discomfort. Warm salt water gargles 3-4 times daily while symptoms persist. May use normal saline nasal spray throughout the day to help with nasal congestion and runny nose. Recommend using a humidifier in  your bedroom at nighttime during sleep and sleeping elevated on pillows while cough symptoms persist. If symptoms do not improve over the next 7 to 10 days, please follow-up in this clinic for further evaluation.  Your urine culture did not indicate an obvious urinary tract infection.  A urine culture has been ordered.  If the results of the culture are positive, you will be contacted and provided treatment.  Follow-up as needed.     ED Prescriptions     Medication Sig Dispense Auth. Provider   brompheniramine-pseudoephedrine-DM 30-2-10 MG/5ML syrup Take 5 mLs by mouth 4 (four) times daily as needed. 140 mL Sharnetta Gielow-Warren, Sadie Haber, NP   fluticasone (FLONASE) 50 MCG/ACT nasal spray Place 2 sprays into both nostrils daily. 16 g Brinly Maietta-Warren, Sadie Haber, NP   cetirizine (ZYRTEC) 10 MG tablet Take 1 tablet (10 mg total) by mouth daily. 30 tablet Bruce Mayers-Warren, Sadie Haber, NP      PDMP not reviewed this encounter.   Abran Cantor, NP 11/13/22 1931

## 2022-11-13 NOTE — ED Triage Notes (Signed)
Pt c/o sore throat and congestion pt states she has been having sx's x 1 week.

## 2022-11-14 LAB — URINE CULTURE: Culture: <10000 — AB

## 2022-11-15 LAB — CULTURE, GROUP A STREP (THRC)

## 2022-11-16 ENCOUNTER — Ambulatory Visit (INDEPENDENT_AMBULATORY_CARE_PROVIDER_SITE_OTHER): Payer: Medicaid Other | Admitting: Adult Health

## 2022-11-16 ENCOUNTER — Encounter: Payer: Self-pay | Admitting: Adult Health

## 2022-11-16 VITALS — BP 112/74 | Ht 63.0 in | Wt 162.0 lb

## 2022-11-16 DIAGNOSIS — F419 Anxiety disorder, unspecified: Secondary | ICD-10-CM | POA: Insufficient documentation

## 2022-11-16 DIAGNOSIS — F32A Depression, unspecified: Secondary | ICD-10-CM

## 2022-11-16 DIAGNOSIS — N911 Secondary amenorrhea: Secondary | ICD-10-CM | POA: Diagnosis not present

## 2022-11-16 LAB — CULTURE, GROUP A STREP (THRC)

## 2022-11-16 MED ORDER — BUPROPION HCL ER (XL) 150 MG PO TB24: 150.0000 mg | ORAL_TABLET | Freq: Every day | ORAL | 3 refills | Status: DC

## 2022-11-16 NOTE — Progress Notes (Signed)
Subjective:     Patient ID: Patricia Vasquez, female   DOB: [birth year], 27 y.o.   MRN: [id number]  HPI Marga is a 27 year old white female,single, G2P1011, back in follow up on taking provera to start a period, had not had one in about a year and she is did start and is still bleeding.     Component Value Date/Time   DIAGPAP  09/13/2021 1011    - Negative for intraepithelial lesion or malignancy (NILM)   ADEQPAP  09/13/2021 1011    Satisfactory for evaluation; transformation zone component PRESENT.   PCP is [facility]  Review of Systems Period started after provera Feels depressed and irritated  Reviewed past medical,surgical, social and family history. Reviewed medications and allergies.     Objective:   Physical Exam BP 112/74 (BP Location: Left Arm, Patient Position: Sitting, Cuff Size: Normal)   Ht 5\' 3"  (1.6 m)   Wt 162 lb (73.5 kg)   LMP 11/12/2022 Comment: Pt was seing by OB/GYN on 10/26/2022 as she has not had a period in 1 yearl.  BMI 28.70 kg/m  Skin warm and dry.Lungs: clear to ausculation bilaterally. Cardiovascular: regular rate and rhythm.        11/16/2022    4:20 PM 05/30/2017    2:22 PM 10/05/2016    2:38 PM  Depression screen PHQ 2/9  Decreased Interest 2 2 1   Down, Depressed, Hopeless 1 1 1   PHQ - 2 Score 3 3 2   Altered sleeping 2 3 2   Tired, decreased energy 3 2 1   Change in appetite 2 3 1   Feeling bad or failure about yourself  2 0 0  Trouble concentrating 2 1 0  Moving slowly or fidgety/restless 0 1 0  Suicidal thoughts 0 0 0  PHQ-9 Score 14 13 6   Difficult doing work/chores  Not difficult at all        11/16/2022    4:21 PM  GAD 7 : Generalized Anxiety Score  Nervous, Anxious, on Edge 1  Control/stop worrying 0  Worry too much - different things 1  Trouble relaxing 1  Restless 0  Easily annoyed or irritable 1  Afraid - awful might happen 1  Total GAD 7 Score 5    Upstream - 11/16/22 1622       Pregnancy Intention Screening    Does the patient want to become pregnant in the next year? Ok Either Way    Does the patient's partner want to become pregnant in the next year? Ok Either Way    Would the patient like to discuss contraceptive options today? No      Contraception Wrap Up   Current Method Female Condom    End Method Female Condom    Contraception Counseling Provided No               Assessment:     1. Secondary amenorrhea Had period after provera Follow up in 3 months to see if periods coming or not, can cycle with OC or provera   2. Anxiety and depression +depressed, she took meds as teen Will try Wellbutrin Meds ordered this encounter  Medications   buPROPion (WELLBUTRIN XL) 150 MG 24 hr tablet    Sig: Take 1 tablet (150 mg total) by mouth daily.    Dispense:  30 tablet    Refill:  3    Order Specific Question:   Supervising Provider    Answer:   Duane Lope H [[id number]]  Will follow up in 3 months for ROS      Plan:     Follow up on 3 months or sooner if needed

## 2022-11-21 ENCOUNTER — Ambulatory Visit: Payer: Medicaid Other | Admitting: Internal Medicine

## 2022-12-10 ENCOUNTER — Other Ambulatory Visit: Payer: Self-pay | Admitting: Gastroenterology

## 2022-12-19 ENCOUNTER — Ambulatory Visit: Payer: Medicaid Other | Admitting: Internal Medicine

## 2022-12-26 ENCOUNTER — Ambulatory Visit: Payer: MEDICAID | Admitting: Gastroenterology

## 2022-12-26 ENCOUNTER — Encounter: Payer: Self-pay | Admitting: Gastroenterology

## 2023-01-13 ENCOUNTER — Other Ambulatory Visit: Payer: Self-pay | Admitting: Gastroenterology

## 2023-01-17 NOTE — Progress Notes (Unsigned)
GI Office Note    Referring Provider: Tylene Fantasia., PA-C Primary Care Physician:  Tylene Fantasia., PA-C Primary Gastroenterologist: Gerrit Friends.Rourk, MD  Date:  01/18/2023  ID:  [name], DOB 05/03/1996, MRN [id number]  Chief Complaint   Chief Complaint  Patient presents with   Follow-up    Patient here today to follow up on her H pylori infection. Patient says she has completed her antibiotics. She says she has still has some occasional nausea. Patient has discontinued omeprazole 40 mg due to finances.    History of Present Illness  LARUEN RISSER is a 27 y.o. 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 Pylori.   Urgent care visit 06/08/22. Twenty four hours of N/V. Previous episode 2 months prior. Reported seeing GYN for abnormal periods . Reported went to previous urgent care and given acid reflux medication but did not start taking it. On methadone for history of drug abuse. UPT negative. Denied drug use while on methadone. No recent sick contacts or new foods. No gas, bloating, urinary symptoms. Given zofran while in urgent care. Mildly tachycardic on exam, otherwise vitals stable. Advised BRAT diet. Encouraged patient to take previously prescribed acid reflux medication.  Initial office visit 09/04/22. Having N/V 1-2 times per month. Usually occurs in the mornings. Only ever throws up stomach acid. Symptoms improved since being on omeprazole. Admitted to frequent vaping. No regular NSAID use. Yes to frequent fried foods. Advised omeprazole and famotidine and check labs. Advised cessation of vaping.   Patient symptoms became more frequent therefore PPI increased to 40 mg daily and EGD scheduled.   EGD 10/23/22: - Normal esophagus. Retained gastric contents.  - Inflamed appearing stomach and duodenum of uncertain significance status post gastric and duodenal  biopsy - Patient likely has an element of cannabinoid hyperemesis syndrome with her frequent cannabis use for nausea and vomiting. 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 H. 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   Today: Since H. Pylori treatment she has not had any vomiting. Having some occasionally nausea however nothing like previously. No epigastric pain. No dysphagia.   Has been off omeprazole since she finished antibiotics due to cost as she was getting this over the counter.  Did not end up taking reglan. No constipation or diarrhea.   Eating well right now. No weight loss.   Wt Readings from Last 3 Encounters:  01/18/23 166 lb 4.8 oz (75.4 kg)  11/16/22 162 lb (73.5 kg)  10/26/22 165 lb 8 oz (75.1 kg)    Current Outpatient Medications  Medication Sig Dispense Refill   methadone (DOLOPHINE) 10 MG/ML solution Take 80 mg by mouth daily.     No current facility-administered medications for this visit.    Past Medical History:  Diagnosis Date   Asthma    Medical history non-contributory     Past Surgical History:  Procedure Laterality Date   ADENOIDECTOMY     BIOPSY  10/23/2022   Procedure: BIOPSY;  Surgeon: Corbin Ade, MD;  [facility];  Service: Endoscopy;;   ESOPHAGOGASTRODUODENOSCOPY (EGD) WITH PROPOFOL N/A 10/23/2022   Procedure: ESOPHAGOGASTRODUODENOSCOPY (EGD) WITH PROPOFOL;  Surgeon: Corbin Ade, MD;  [facility];  Service: Endoscopy;  Laterality:  N/A;  230PM, ASA 2   TONSILLECTOMY     UPPER GI ENDOSCOPY      Family History  Problem Relation Age of Onset   Hypertension Father    Cancer Maternal Grandfather        luekemia   Cancer Paternal Grandfather        lung    Allergies as of 01/18/2023 - Review Complete 01/18/2023  Allergen  Reaction Noted   Keflex [cephalexin] Rash 04/24/2017    Social History   Socioeconomic History   Marital status: Single    Spouse name: Not on file   Number of children: Not on file   Years of education: Not on file   Highest education level: Not on file  Occupational History   Not on file  Tobacco Use   Smoking status: Some Days    Types: E-cigarettes, Cigarettes   Smokeless tobacco: Never  Vaping Use   Vaping status: Every Day   Substances: Nicotine  Substance and Sexual Activity   Alcohol use: Not Currently   Drug use: Not Currently   Sexual activity: Not Currently    Birth control/protection: None, Condom  Other Topics Concern   Not on file  Social History Narrative   Not on file   Social Determinants of Health   Financial Resource Strain: Not on file  Food Insecurity: Not on file  Transportation Needs: Not on file  Physical Activity: Not on file  Stress: Not on file  Social Connections: Not on file     Review of Systems   Gen: Denies fever, chills, anorexia. Denies fatigue, weakness, weight loss.  CV: Denies chest pain, palpitations, syncope, peripheral edema, and claudication. Resp: Denies dyspnea at rest, cough, wheezing, coughing up blood, and pleurisy. GI: See HPI Derm: Denies rash, itching, dry skin Psych: Denies depression, anxiety, memory loss, confusion. No homicidal or suicidal ideation.  Heme: Denies bruising, bleeding, and enlarged lymph nodes.   Physical Exam   BP 117/69 (BP Location: Left Arm, Patient Position: Sitting, Cuff Size: Normal)   Pulse 79   Temp (!) 97.3 F (36.3 C) (Temporal)   Ht 5\' 3"  (1.6 m)   Wt 166 lb 4.8 oz (75.4 kg)   LMP 12/25/2022 (Approximate)   BMI 29.46 kg/m   General:   Alert and oriented. No distress noted. Pleasant and cooperative.  Head:  Normocephalic and atraumatic. Eyes:  Conjuctiva clear without scleral icterus. Mouth:  Oral mucosa pink and moist. Good dentition. No lesions. Abdomen:  +BS, soft,  non-tender and non-distended. No rebound or guarding. No HSM or masses noted. Rectal: deferred Msk:  Symmetrical without gross deformities. Normal posture. Extremities:  Without edema. Neurologic:  Alert and  oriented x4 Psych:  Alert and cooperative. Normal mood and affect.   Assessment  CHANNELL QUATTRONE is a 27 y.o. 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   GERD, H. Pylori gastritis, N/V: Previously with significant nausea and vomiting and epigastric discomfort.  Treated with increase PPI dose however no improvement therefore she underwent EGD with gastritis and biopsies positive for H. pylori.  She was treated with Pylera and PPI twice daily.  Shortly after finishing her antibiotics she stopped taking PPI due to cost that she was getting an over-the-counter and could not afford it.  Still has some occasional nausea but no vomiting and tolerating diet well.  We will perform H. pylori breath test to assess for  eradication given she has been off PPI for greater than 2 weeks.  Advised her she may continue to use omeprazole 20 mg daily as needed and we will send a prescription for her.  GERD diet reinforced.  Advise she may use Zofran for severe refractory nausea.  PLAN   H. Pylori breath test Continue zofran as needed.  Omeprazole 20 mg daily as needed GERD diet Follow up 6 months    Brooke Bonito, MSN, FNP-BC, AGACNP-BC [facility]

## 2023-01-18 ENCOUNTER — Encounter: Payer: Self-pay | Admitting: Gastroenterology

## 2023-01-18 ENCOUNTER — Ambulatory Visit (INDEPENDENT_AMBULATORY_CARE_PROVIDER_SITE_OTHER): Payer: MEDICAID | Admitting: Gastroenterology

## 2023-01-18 VITALS — BP 117/69 | HR 79 | Temp 97.3°F | Ht 63.0 in | Wt 166.3 lb

## 2023-01-18 DIAGNOSIS — R112 Nausea with vomiting, unspecified: Secondary | ICD-10-CM | POA: Diagnosis not present

## 2023-01-18 DIAGNOSIS — B9681 Helicobacter pylori [H. pylori] as the cause of diseases classified elsewhere: Secondary | ICD-10-CM | POA: Diagnosis not present

## 2023-01-18 DIAGNOSIS — K2970 Gastritis, unspecified, without bleeding: Secondary | ICD-10-CM

## 2023-01-18 DIAGNOSIS — K219 Gastro-esophageal reflux disease without esophagitis: Secondary | ICD-10-CM

## 2023-01-18 MED ORDER — OMEPRAZOLE 20 MG PO CPDR: 20.0000 mg | DELAYED_RELEASE_CAPSULE | Freq: Every day | ORAL | 2 refills | Status: DC

## 2023-01-18 NOTE — Patient Instructions (Addendum)
Please go to [facility] for H. Pylori breath test.  After you have this done you can start back omeprazole 20 mg once daily as needed.  Follow a GERD diet:  Avoid fried, fatty, greasy, spicy, citrus foods. Avoid caffeine and carbonated beverages. Avoid chocolate. Try eating 4-6 small meals a day rather than 3 large meals. Do not eat within 3 hours of laying down. Prop head of bed up on wood or bricks to create a 6 inch incline.  You can use peppermint or ginger over-the-counter to help with nausea or you can continue to use the Zofran previously prescribed.  Follow up in 6 months, sooner if needed.  Will be in touch with the results of your breath test once received.  It was a pleasure to see you today. I want to create trusting relationships with patients. If you receive a survey regarding your visit,  I greatly appreciate you taking time to fill this out on paper or through your MyChart. I value your feedback.  Brooke Bonito, MSN, FNP-BC, AGACNP-BC [facility]

## 2023-01-23 ENCOUNTER — Other Ambulatory Visit: Payer: Self-pay | Admitting: Gastroenterology

## 2023-01-25 ENCOUNTER — Encounter: Payer: Self-pay | Admitting: *Deleted

## 2023-01-28 ENCOUNTER — Other Ambulatory Visit: Payer: Self-pay | Admitting: Gastroenterology

## 2023-01-28 MED ORDER — AMOXICILLIN 500 MG PO TABS: 750.0000 mg | ORAL_TABLET | Freq: Three times a day (TID) | ORAL | 0 refills | Status: AC

## 2023-01-28 MED ORDER — LEVOFLOXACIN 750 MG PO TABS: 750.0000 mg | ORAL_TABLET | Freq: Every day | ORAL | 0 refills | Status: AC

## 2023-01-28 MED ORDER — ESOMEPRAZOLE MAGNESIUM 40 MG PO CPDR: 40.0000 mg | DELAYED_RELEASE_CAPSULE | Freq: Two times a day (BID) | ORAL | 0 refills | Status: DC

## 2023-01-30 ENCOUNTER — Encounter: Payer: Self-pay | Admitting: Gastroenterology

## 2023-02-16 ENCOUNTER — Ambulatory Visit: Payer: MEDICAID | Admitting: Adult Health

## 2023-03-13 ENCOUNTER — Ambulatory Visit: Payer: MEDICAID | Admitting: Adult Health

## 2023-07-20 NOTE — Progress Notes (Deleted)
GI Office Note    Referring Provider: Tylene Fantasia., PA-C Primary Care Physician:  Tylene Fantasia., PA-C Primary Gastroenterologist: *** Date:  07/20/2023  ID:  [name], DOB 22-Sep-1995, MRN [id number]   Chief Complaint   No chief complaint on file.   History of Present Illness  ELINA STRENG is a 28 y.o. 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   Urgent care visit 06/08/22. Twenty four hours of N/V. Previous episode 2 months prior. Reported seeing GYN for abnormal periods . Reported went to previous urgent care and given acid reflux medication but did not start taking it. On methadone for history of drug abuse. UPT negative. Denied drug use while on methadone. No recent sick contacts or new foods. No gas, bloating, urinary symptoms. Given zofran while in urgent care. Mildly tachycardic on exam, otherwise vitals stable. Advised BRAT diet. Encouraged patient to take previously prescribed acid reflux medication.   Initial office visit 09/04/22. Having N/V 1-2 times per month. Usually occurs in the mornings. Only ever throws up stomach acid. Symptoms improved since being on omeprazole. Admitted to frequent vaping. No regular NSAID use. Yes to frequent fried foods. Advised omeprazole and famotidine and check labs. Advised cessation of vaping.    Patient symptoms became more frequent therefore PPI increased to 40 mg daily and EGD scheduled.    EGD 10/23/22: - Normal esophagus. Retained gastric contents.  - Inflamed appearing stomach and duodenum of uncertain significance status post gastric and duodenal biopsy - Patient likely has an element of cannabinoid hyperemesis syndrome with her frequent cannabis use for nausea and vomiting. 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 H. 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   Last office visit 01/18/23. ***. Advised H. pylori breath test given she was off PPI for greater than 2 weeks.  Advised continue Zofran as needed as well as low-dose PPI as needed.  Follow-up in 6 months.  H. pylori breath test remained + 01/18/2023 therefore patient was recommended to undergo levofloxacin triple therapy.  She was given Nexium 40 mg twice daily for 14 days along with levofloxacin 500 mg once daily for 14 days and amoxicillin 750 mg 3 times daily for 14 days.    Today:  GERD -    Wt Readings from Last 3 Encounters:  01/18/23 166 lb 4.8 oz (75.4 kg)  11/16/22 162 lb (73.5 kg)  10/26/22 165 lb 8 oz (75.1 kg)    Current Outpatient Medications  Medication Sig Dispense Refill   esomeprazole (NEXIUM) 40 MG capsule Take 1 capsule (40 mg total) by mouth 2 (two) times daily before a meal for 14 days. 28 capsule 0   methadone (DOLOPHINE) 10 MG/ML solution Take 80 mg by mouth daily.     No current facility-administered medications for this visit.    Past Medical History:  Diagnosis Date   Asthma    Medical history non-contributory     Past Surgical History:  Procedure Laterality Date   ADENOIDECTOMY     BIOPSY  10/23/2022   Procedure: BIOPSY;  Surgeon: Corbin Ade, MD;  [facility];  Service: Endoscopy;;   ESOPHAGOGASTRODUODENOSCOPY (EGD) WITH PROPOFOL N/A 10/23/2022   Procedure: ESOPHAGOGASTRODUODENOSCOPY (EGD) WITH PROPOFOL;  Surgeon: Corbin Ade, MD;  [facility];  Service: Endoscopy;  Laterality: N/A;  230PM, ASA 2   TONSILLECTOMY     UPPER GI ENDOSCOPY      Family History  Problem Relation Age of Onset   Hypertension Father    Cancer Maternal Grandfather        luekemia   Cancer Paternal Grandfather        lung    Allergies as of 07/23/2023 - Review Complete 01/18/2023  Allergen Reaction Noted   Keflex [cephalexin]  Rash 04/24/2017    Social History   Socioeconomic History   Marital status: Single    Spouse name: Not on file   Number of children: Not on file   Years of education: Not on file   Highest education level: Not on file  Occupational History   Not on file  Tobacco Use   Smoking status: Some Days    Types: E-cigarettes, Cigarettes   Smokeless tobacco: Never  Vaping Use   Vaping status: Every Day   Substances: Nicotine  Substance and Sexual Activity   Alcohol use: Not Currently   Drug use: Not Currently   Sexual activity: Not Currently    Birth control/protection: None, Condom  Other Topics Concern   Not on file  Social History Narrative   Not on file   Social Drivers of Health   Financial Resource Strain: Not on file  Food Insecurity: Not on file  Transportation Needs: Not on file  Physical Activity: Not on file  Stress: Not on file  Social Connections: Not on file     Review of Systems   Gen: Denies fever, chills, anorexia. Denies fatigue, weakness, weight loss.  CV: Denies chest pain, palpitations, syncope, peripheral edema, and claudication. Resp: Denies dyspnea at rest, cough, wheezing, coughing up blood, and pleurisy. GI: See HPI Derm: Denies rash, itching, dry skin Psych: Denies depression, anxiety, memory loss, confusion. No homicidal or suicidal ideation.  Heme: Denies bruising, bleeding, and enlarged lymph nodes.  Physical Exam   There were no vitals taken for this visit.  General:   Alert and oriented. No distress noted. Pleasant and cooperative.  Head:  Normocephalic and atraumatic. Eyes:  Conjuctiva clear without scleral icterus. Mouth:  Oral mucosa pink and moist. Good dentition. No lesions. Lungs:  Clear to auscultation bilaterally. No wheezes, rales, or rhonchi. No distress.  Heart:  S1, S2 present without murmurs appreciated.  Abdomen:  +BS, soft, non-tender and non-distended. No rebound or guarding. No HSM or masses noted. Rectal: *** Msk:   Symmetrical without gross deformities. Normal posture. Extremities:  Without edema. Neurologic:  Alert and  oriented x4 Psych:  Alert and cooperative. Normal mood and affect.  Assessment  MILANY GECK is a 28 y.o. female with a history of *** presenting today with   GERD, H. pylori gastritis, N/V:  PLAN   *** H. Pylori stool antigen test ?? GERD diet     Brooke Bonito, MSN, FNP-BC, AGACNP-BC [facility]

## 2023-07-23 ENCOUNTER — Telehealth: Payer: Self-pay | Admitting: Gastroenterology

## 2023-07-23 ENCOUNTER — Ambulatory Visit: Payer: Commercial Managed Care - HMO | Admitting: Gastroenterology

## 2023-07-23 ENCOUNTER — Encounter: Payer: Self-pay | Admitting: *Deleted

## 2023-07-23 NOTE — Telephone Encounter (Signed)
Patient was no-show for her office visit today.  She was treated for H. pylori in 2024 and remained positive after her first treatment therefore we sent in medications for her to be retreated in July 2024.  She has not been seen since then nor has retesting been done after we sent and repeated treatment.   Mandy/Susan - At minimum we should try to get a virtual visit with her if she is unable to make it into the office.  Please see if we can verify with her insurance if virtual visit can be covered and reach out to patient and offer her this.  We need to find out if she took all of her medications given to her in July 2024 and if so we need to find out if she is still taking Nexium so we can determine when we can send her for repeat testing.  Brooke Bonito, MSN, APRN, FNP-BC, AGACNP-BC [facility]

## 2023-07-24 NOTE — Telephone Encounter (Signed)
LMOM for patient to return call to reschedule OV with CM

## 2023-07-25 NOTE — Telephone Encounter (Signed)
Sent pt a [facility] geologist. Also LMOM for pt to call office.

## 2023-07-26 ENCOUNTER — Encounter: Payer: Self-pay | Admitting: Gastroenterology

## 2023-07-27 ENCOUNTER — Encounter: Payer: Self-pay | Admitting: *Deleted

## 2023-07-27 NOTE — Telephone Encounter (Signed)
Tried several times to reach pt. Mailed a letter.  

## 2023-08-14 NOTE — Telephone Encounter (Signed)
 noted

## 2023-08-24 ENCOUNTER — Encounter (INDEPENDENT_AMBULATORY_CARE_PROVIDER_SITE_OTHER): Payer: Self-pay

## 2023-08-26 NOTE — Progress Notes (Unsigned)
 Referring Provider: Tylene Fantasia., PA-C Primary Care Physician:  Tylene Fantasia., PA-C Primary GI Physician: Dr. Jena Gauss  Chief Complaint  Patient presents with   Follow-up    Patient here today for a follow up on her recent H pylori infection. Patient says she did not finish the Amoxicillin nor the ppi.She says she does not know why she stopped them. Patient says she is still having issues with nausea daily. Patient reports she went to the urgent care two days ago and told them she had H pylori and they prescribed Doxycyline 100 mg bid, and Metoclopramide 5 mg one every eight hours prn.    HPI:   Patricia Vasquez is a 28 y.o. 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    Initial office visit with Brooke Bonito, NP 09/04/22. Having N/V 1-2 times per month. Usually occurs in the mornings. Only ever throws up stomach acid. Symptoms improved since being on omeprazole. Admitted to frequent vaping. No regular NSAID use. Yes to frequent fried foods. Advised omeprazole and famotidine and check labs. Advised cessation of vaping.    Patient symptoms became more frequent therefore PPI increased to 40 mg twice daily and EGD scheduled.    EGD 10/23/22: - Normal esophagus. Retained gastric contents.  - Inflamed appearing stomach and duodenum of uncertain significance status post gastric and duodenal biopsy - Patient likely has an element of cannabinoid hyperemesis syndrome with her frequent cannabis use for nausea and vomiting. 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 H. Pylori + gastritis, Pylera prescribed with PPI BID   Patient advised cessation of THC/CBD and given reglan for worsening nausea/vomiting and possible drug induced gastroparesis.    Last office visit 01/18/23.  Reported no vomiting since H. pylori treatment, but still occasionally with nausea.  She  was off omeprazole and reported she did not end up taking Reglan.  She was eating well.  Weight was stable.. Advised H. pylori breath test given she was off PPI for greater than 2 weeks.  Advised continue Zofran as needed as well as low-dose PPI as needed.  Follow-up in 6 months.   H. pylori breath test remained + 01/18/2023 therefore patient was recommended to undergo levofloxacin triple therapy.  She was given Nexium 40 mg twice daily for 14 days along with levofloxacin 500 mg once daily for 14 days and amoxicillin 750 mg 3 times daily for 14 days.    Patient message sent 07/23/2023 due to patient missing her office visit.  CMA asked patient if she took all medications given in July patient reported that she still had a couple left of each but was still feeling nauseous and almost vomiting.  Recommended office visit to regroup.   Today: Patient reports she did not complete her prescription of antibiotics or Nexium that she was prescribed at her last office visit.  She is not sure why she stopped taking the medication.  States she just kept forgetting to take it. States she took most of then, only had about 10 pills left.   States she is still having issues with nausea every day. No vomiting since first round of antibiotics. Taking reglan once a day which is helpful. Taking it is response to whenever she has nausea, not scheduled. Nausea can be related to meals. Sometimes doesn't have an appetite. Has early satiety. No real heartburn symptoms. Gets more nauseated when  running around getting hot.    Reports she went to urgent care 2 days ago and told him to have H. pylori and prescribed doxycycline 100 mg twice daily for tooth pain for 10 days.    Smoking marijuana daily. Still vaping.   When asking what time of day of her last meal is, she states that prior to site.  States she can wake up at 3 AM hungry and will eat.  Tends to eat junk food, candy, etc.        Past Medical History:   Diagnosis Date   Asthma    Medical history non-contributory     Past Surgical History:  Procedure Laterality Date   ADENOIDECTOMY     BIOPSY  10/23/2022   Procedure: BIOPSY;  Surgeon: Corbin Ade, MD;  [facility];  Service: Endoscopy;;   ESOPHAGOGASTRODUODENOSCOPY (EGD) WITH PROPOFOL N/A 10/23/2022   Procedure: ESOPHAGOGASTRODUODENOSCOPY (EGD) WITH PROPOFOL;  Surgeon: Corbin Ade, MD;  [facility];  Service: Endoscopy;  Laterality: N/A;  230PM, ASA 2   TONSILLECTOMY     UPPER GI ENDOSCOPY      Current Outpatient Medications  Medication Sig Dispense Refill   doxycycline (ADOXA) 100 MG tablet Take 100 mg by mouth 2 (two) times daily.     METHADONE HCL PO Take 70 mg by mouth daily.     metoCLOPramide (REGLAN) 5 MG tablet Take 5 mg by mouth every 8 (eight) hours as needed for nausea.     No current facility-administered medications for this visit.    Allergies as of 08/29/2023 - Review Complete 08/29/2023  Allergen Reaction Noted   Keflex [cephalexin] Rash 04/24/2017    Family History  Problem Relation Age of Onset   Hypertension Father    Cancer Maternal Grandfather        luekemia   Cancer Paternal Grandfather        lung    Social History   Socioeconomic History   Marital status: Single    Spouse name: Not on file   Number of children: Not on file   Years of education: Not on file   Highest education level: Not on file  Occupational History   Not on file  Tobacco Use   Smoking status: Some Days    Types: E-cigarettes, Cigarettes   Smokeless tobacco: Never  Vaping Use   Vaping status: Every Day   Substances: Nicotine  Substance and Sexual Activity   Alcohol use: Not Currently   Drug use: Yes    Types: Marijuana   Sexual activity: Not Currently    Birth control/protection: None, Condom  Other Topics Concern   Not on file  Social History Narrative   Not on file   Social Drivers of Health   Financial Resource Strain: Not  on file  Food Insecurity: Not on file  Transportation Needs: Not on file  Physical Activity: Not on file  Stress: Not on file  Social Connections: Not on file    Review of Systems: Gen: Denies fever, chills, cold or flulike symptoms, presyncope, syncope. GI: See HPI Heme: See HPI  Physical Exam: BP 106/68 (BP Location: Right Arm, Patient Position: Sitting, Cuff Size: Large)   Pulse (!) 101   Temp 97.7 F (36.5 C) (Temporal)   Ht 5\' 3"  (1.6 m)   Wt 195 lb 9.6 oz (88.7 kg)   LMP 08/29/2023   BMI 34.65 kg/m  General:   Alert and oriented. No distress noted. Pleasant and  cooperative.  Head:  Normocephalic and atraumatic. Eyes:  Conjuctiva clear without scleral icterus. Abdomen:  +BS, soft, non-tender and non-distended. No rebound or guarding. No HSM or masses noted. Msk:  Symmetrical without gross deformities. Normal posture. Neurologic:  Alert and  oriented x4 Psych:  Normal mood and affect.    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   History of H. pylori gastritis: Diagnosed at the time of EGD 10/23/2022.  Treated with Pylera.  Repeat H. pylori breath test was positive in July 2024.  Was prescribed levofloxacin triple therapy which included Nexium twice daily, levofloxacin daily, amoxicillin 3 times daily x 14 days.  Unfortunately, patient did not complete her course of antibiotics or PPI as prescribed.  Will need to recheck H. pylori breath test to evaluate for persistent infection.  Nausea/vomiting: Possibly multifactorial in the setting of H. 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  Currently with daily nausea, but no vomiting, taking Reglan once a day in response to nausea rather than on a  scheduled basis.  Plan to complete H. pylori breath test in the near future to evaluate for eradication.  Also advised that she can try taking Reglan on a scheduled basis in the morning before breakfast in the evening for dinner.  Counseled on cessation of cannabis use as well as dietary changes that are needed.  Plan:  H. pylori breath test to be completed after she has been off of all antibiotics for 14 days.  Currently taking doxycycline for tooth infection. Continue metoclopramide.  Recommended trying to take this medication in the morning for breakfast in the evening before dinner in efforts to prevent nausea. Dietary recommendations: Avoid fried, fatty, greasy, spicy, citrus foods. Avoid caffeine and carbonated beverages. Avoid chocolate. Try eating 4-6 small meals a day rather than 3 large meals. Do not eat within 3 hours of laying down. Prop head of bed up on wood or bricks to create a 6 inch incline. Counseled on portance of abstinence from cannabis. Follow-up in 3 months or sooner if needed.    Ermalinda Memos, PA-C [facility] 08/29/2023

## 2023-08-27 ENCOUNTER — Ambulatory Visit: Payer: Commercial Managed Care - HMO | Admitting: Gastroenterology

## 2023-08-29 ENCOUNTER — Encounter: Payer: Self-pay | Admitting: Gastroenterology

## 2023-08-29 ENCOUNTER — Ambulatory Visit (INDEPENDENT_AMBULATORY_CARE_PROVIDER_SITE_OTHER): Payer: Commercial Managed Care - HMO | Admitting: Gastroenterology

## 2023-08-29 VITALS — BP 106/68 | HR 101 | Temp 97.7°F | Ht 63.0 in | Wt 195.6 lb

## 2023-08-29 DIAGNOSIS — F1290 Cannabis use, unspecified, uncomplicated: Secondary | ICD-10-CM

## 2023-08-29 DIAGNOSIS — Z8619 Personal history of other infectious and parasitic diseases: Secondary | ICD-10-CM | POA: Insufficient documentation

## 2023-08-29 DIAGNOSIS — R110 Nausea: Secondary | ICD-10-CM | POA: Insufficient documentation

## 2023-08-29 NOTE — Patient Instructions (Signed)
 Complete the course of doxycycline as prescribed by urgent care.  After completing your course of doxycycline, you will need to wait 14 days, then complete H. pylori breath test on day 15 or thereafter at [facility].   It is also important you do not take any over-the-counter antacid medications 14 days prior to completing H. pylori breath test.  Continue to use metoclopramide as needed for nausea.  You can try taking this medication first thing in the morning before breakfast and again in the evening before dinner scheduled rather than as needed.  Dietary recommendations: Avoid fried, fatty, greasy, spicy, citrus foods. Avoid caffeine and carbonated beverages. Avoid chocolate. Try eating 4-6 small meals a day rather than 3 large meals. Do not eat within 3 hours of laying down. Prop head of bed up on wood or bricks to create a 6 inch incline.   As we discussed, it is also important that you stop using cannabis.  I will plan to see you back in the office in 3 months, but we will be in contact with you regarding your H. pylori breath test results.  Ermalinda Memos, PA-C [facility]

## 2023-09-06 ENCOUNTER — Ambulatory Visit: Payer: Commercial Managed Care - HMO | Admitting: Gastroenterology

## 2023-09-18 ENCOUNTER — Ambulatory Visit: Payer: Self-pay | Admitting: Family Medicine

## 2023-11-27 NOTE — Progress Notes (Deleted)
 Referring Provider: Bolivar Bushman., PA-C Primary Care Physician:  Bolivar Bushman., PA-C Primary GI Physician: Dr. Riley Cheadle  No chief complaint on file.   HPI:   Patricia Vasquez is a 28 y.o. 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 pylori.  EGD 10/23/22 for N/V: - Normal esophagus. Retained gastric contents.  - Inflamed appearing stomach and duodenum of uncertain significance status post gastric and duodenal biopsy - Patient likely has an element of cannabinoid hyperemesis syndrome with her frequent cannabis use for nausea and vomiting. 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 H. Pylori + gastritis, Pylera prescribed with PPI BID  H. pylori breath test remained + 01/18/2023 therefore patient was recommended to undergo levofloxacin  triple therapy. She was given Nexium  40 mg twice daily for 14 days along with levofloxacin  500 mg once daily for 14 days and amoxicillin  750 mg 3 times daily for 14 days.   Last seen in the office 08/29/2023.  Reported she did not complete her prescription of antibiotics or Nexium  as prescribed at last visit.  States she kept forgetting to take it. Continues with daily nausea, but no vomiting since first round of antibiotics.  Taking Reglan  as needed once a day which was helpful.  Reported early satiety.  Nausea also triggered by increased activity and getting hot.  Still smoking marijuana daily.  Recommended H. pylori breath test as she had been off antibiotics for 14 days (currently taking prescription of doxycycline for tooth infection), can try Reglan  scheduled twice daily for now, counseled on GERD diet/gastroparesis diet, importance of abstinence from cannabis.  Recommended 46-month follow-up.   H. pylori breath test was never completed.   Today:   Past Medical History:  Diagnosis Date    Asthma    Medical history non-contributory     Past Surgical History:  Procedure Laterality Date   ADENOIDECTOMY     BIOPSY  10/23/2022   Procedure: BIOPSY;  Surgeon: Suzette Espy, MD;  [facility];  Service: Endoscopy;;   ESOPHAGOGASTRODUODENOSCOPY (EGD) WITH PROPOFOL  N/A 10/23/2022   Procedure: ESOPHAGOGASTRODUODENOSCOPY (EGD) WITH PROPOFOL ;  Surgeon: Suzette Espy, MD;  [facility];  Service: Endoscopy;  Laterality: N/A;  230PM, ASA 2   TONSILLECTOMY     UPPER GI ENDOSCOPY      Current Outpatient Medications  Medication Sig Dispense Refill   doxycycline (ADOXA) 100 MG tablet Take 100 mg by mouth 2 (two) times daily.     METHADONE HCL PO Take 70 mg by mouth daily.     metoCLOPramide  (REGLAN ) 5 MG tablet Take 5 mg by mouth every 8 (eight) hours as needed for nausea.     No current facility-administered medications for this visit.    Allergies as of 11/28/2023 - Review Complete 08/29/2023  Allergen Reaction Noted   Keflex [cephalexin] Rash 04/24/2017    Family History  Problem Relation Age of Onset   Hypertension Father    Cancer Maternal Grandfather        luekemia   Cancer Paternal Grandfather        lung    Social History   Socioeconomic History   Marital status: Single    Spouse name: Not on file   Number of children: Not on file   Years of education: Not on file   Highest education level: Not on file  Occupational History   Not on file  Tobacco Use   Smoking status: Some Days    Types: E-cigarettes, Cigarettes   Smokeless tobacco: Never  Vaping Use   Vaping status: Every Day   Substances: Nicotine  Substance and Sexual Activity   Alcohol use: Not Currently   Drug use: Yes    Types: Marijuana    Comment: daily   Sexual activity: Not Currently    Birth control/protection: None, Condom  Other Topics Concern   Not on file  Social History Narrative   Not on file   Social Drivers of Health   Financial Resource Strain:  Not on file  Food Insecurity: Not on file  Transportation Needs: Not on file  Physical Activity: Not on file  Stress: Not on file  Social Connections: Not on file    Review of Systems: Gen: Denies fever, chills, anorexia. Denies fatigue, weakness, weight loss.  CV: Denies chest pain, palpitations, syncope, peripheral edema, and claudication. Resp: Denies dyspnea at rest, cough, wheezing, coughing up blood, and pleurisy. GI: Denies vomiting blood, jaundice, and fecal incontinence.   Denies dysphagia or odynophagia. Derm: Denies rash, itching, dry skin Psych: Denies depression, anxiety, memory loss, confusion. No homicidal or suicidal ideation.  Heme: Denies bruising, bleeding, and enlarged lymph nodes.  Physical Exam: There were no vitals taken for this visit. General:   Alert and oriented. No distress noted. Pleasant and cooperative.  Head:  Normocephalic and atraumatic. Eyes:  Conjuctiva clear without scleral icterus. Heart:  S1, S2 present without murmurs appreciated. Lungs:  Clear to auscultation bilaterally. No wheezes, rales, or rhonchi. No distress.  Abdomen:  +BS, soft, non-tender and non-distended. No rebound or guarding. No HSM or masses noted. Msk:  Symmetrical without gross deformities. Normal posture. Extremities:  Without edema. Neurologic:  Alert and  oriented x4 Psych:  Normal mood and affect.    Assessment:     Plan:  ***   Shana Daring, PA-C [facility] 11/28/2023

## 2023-11-28 ENCOUNTER — Ambulatory Visit: Payer: MEDICAID | Admitting: Gastroenterology

## 2024-01-07 LAB — H. PYLORI BREATH TEST: H. pylori Breath Test: NOT DETECTED

## 2024-01-08 ENCOUNTER — Ambulatory Visit: Payer: Self-pay | Admitting: Gastroenterology

## 2024-07-06 ENCOUNTER — Ambulatory Visit: Payer: Self-pay

## 2024-07-29 ENCOUNTER — Ambulatory Visit (HOSPITAL_COMMUNITY)
Admission: EM | Admit: 2024-07-29 | Discharge: 2024-07-29 | Discharge status: Against Medical Advice | Payer: MEDICAID | Source: Home / Self Care | Attending: Psychiatry | Admitting: Psychiatry

## 2024-07-29 NOTE — ED Notes (Signed)
Patient LWBS after triage.

## 2024-08-01 ENCOUNTER — Encounter: Payer: Self-pay | Admitting: Physician Assistant

## 2024-08-01 ENCOUNTER — Ambulatory Visit: Payer: Self-pay | Admitting: Physician Assistant

## 2024-08-01 VITALS — BP 119/79 | HR 87 | Temp 98.9°F | Ht 63.0 in | Wt 182.0 lb

## 2024-08-01 DIAGNOSIS — Z87898 Personal history of other specified conditions: Secondary | ICD-10-CM | POA: Insufficient documentation

## 2024-08-01 DIAGNOSIS — F1220 Cannabis dependence, uncomplicated: Secondary | ICD-10-CM | POA: Insufficient documentation

## 2024-08-01 DIAGNOSIS — R110 Nausea: Secondary | ICD-10-CM | POA: Insufficient documentation

## 2024-08-01 MED ORDER — ONDANSETRON 4 MG PO TBDP: 4.0000 mg | ORAL_TABLET | Freq: Three times a day (TID) | ORAL | 1 refills | Status: AC | PRN

## 2024-08-01 NOTE — Assessment & Plan Note (Signed)
 Persisting for 2-3 years, primarily in the mid-abdomen and lower abdomen. Symptoms include daily nausea, fatigue, and abdominal pain. Previous H. pylori infection partially treated, but symptoms persist. Possible contributing factors include poor diet, dehydration, and cannabis use. - Advised to follow up with GI specialist for retesting and management of H. pylori infection. - Encouraged dietary changes including increased intake of lean proteins, fruits, and vegetables. - Advised increasing water intake to improve hydration. - Recommended establishing a regular sleep schedule. - Discussed potential alternative nausea medications due to side effects of Reglan .

## 2024-08-01 NOTE — Patient Instructions (Signed)
 Please follow up with GI and OB GYN

## 2024-08-01 NOTE — Assessment & Plan Note (Signed)
 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 Previously used fentanyl  for 3 years. Currently attending monthly follow-ups at [facility]. - Continue methadone maintenance therapy with monthly follow-ups at [facility].

## 2024-08-01 NOTE — Progress Notes (Signed)
 "  New Patient Office Visit  Subjective    Patient ID: Patricia Vasquez, female    DOB: 1996/03/31  Age: 29 y.o. MRN: [id number]  CC:  Chief Complaint  Patient presents with   Abdominal Pain    Frequent and sever with nausea for the past couple of years, hx of h pylori was treeated with 3 abx      HPI Patricia Vasquez presents to establish care  Discussed the use of AI scribe software for clinical note transcription with the patient, who gave verbal consent to proceed.  History of Present Illness Patricia Vasquez is a 29 year old female who presents with persistent nausea and fatigue.  She has experienced persistent nausea and fatigue for the past two to three years. Nausea occurs every morning, requiring her to lie down until it subsides. Fatigue is significant, leading to excessive sleep. She describes feeling 'lazy' and unwell.  She has a history of H. pylori infection, which was only partially treated, and gastrointestinal symptoms persist. She experiences stomach pain primarily in the middle and lower abdomen. Her diet mainly consists of fast food, and she acknowledges poor hydration, which she believes may contribute to her symptoms.  She reports a strong odor in her urine for about a year but no burning sensation during urination. She frequently feels dehydrated and admits to not drinking much water.  Her current medications include methadone, which she has been taking for four years, and Reglan  for nausea, which she uses as needed. She previously used fentanyl  for about three years before starting methadone. She also uses marijuana daily, which she believes helps with her nausea.  She has a history of smoking cigarettes but currently vapes daily. She is working on improving her diet and hydration. No vomiting, burning during urination, or recent GU infections. She reports nausea, fatigue, and stomach pain.   Outpatient Encounter Medications as of 08/01/2024  Medication Sig    METHADONE HCL PO Take 70 mg by mouth daily.   methocarbamol  (ROBAXIN ) 500 MG tablet Take 500 mg by mouth 3 (three) times daily.   metoCLOPramide  (REGLAN ) 5 MG tablet Take 5 mg by mouth every 8 (eight) hours as needed for nausea.   ondansetron  (ZOFRAN -ODT) 4 MG disintegrating tablet Take 1 tablet (4 mg total) by mouth every 8 (eight) hours as needed for nausea or vomiting.   [DISCONTINUED] doxycycline (ADOXA) 100 MG tablet Take 100 mg by mouth 2 (two) times daily.   No facility-administered encounter medications on file as of 08/01/2024.    Past Medical History:  Diagnosis Date   Medical history non-contributory     Past Surgical History:  Procedure Laterality Date   ADENOIDECTOMY     BIOPSY  10/23/2022   Procedure: BIOPSY;  Surgeon: Shaaron Lamar HERO, MD;  Location: [facility];  Service: Endoscopy;;   ESOPHAGOGASTRODUODENOSCOPY (EGD) WITH PROPOFOL  N/A 10/23/2022   Procedure: ESOPHAGOGASTRODUODENOSCOPY (EGD) WITH PROPOFOL ;  Surgeon: Shaaron Lamar HERO, MD;  Location: [facility];  Service: Endoscopy;  Laterality: N/A;  230PM, ASA 2   TONSILLECTOMY     UPPER GI ENDOSCOPY      Family History  Problem Relation Age of Onset   Hypertension Father    Cancer Maternal Grandfather        luekemia   Cancer Paternal Grandfather        lung    Social History   Socioeconomic History   Marital status: Single    Spouse name: Not on file  Number of children: Not on file   Years of education: Not on file   Highest education level: Not on file  Occupational History   Not on file  Tobacco Use   Smoking status: Some Days    Types: E-cigarettes, Cigarettes   Smokeless tobacco: Never  Vaping Use   Vaping status: Every Day   Substances: Nicotine  Substance and Sexual Activity   Alcohol use: Not Currently   Drug use: Yes    Types: Marijuana    Comment: daily   Sexual activity: Not Currently    Birth control/protection: None, Condom  Other Topics Concern   Not on file  Social History  Narrative   Not on file   Social Drivers of Health   Tobacco Use: High Risk (08/01/2024)   Patient History    Smoking Tobacco Use: Some Days    Smokeless Tobacco Use: Never    Passive Exposure: Not on file  Financial Resource Strain: Not on file  Food Insecurity: Not on file  Transportation Needs: Not on file  Physical Activity: Not on file  Stress: Not on file  Social Connections: Not on file  Intimate Partner Violence: Not on file  Depression (EYV7-0): Medium Risk (08/01/2024)   Depression (PHQ2-9)    PHQ-2 Score: 6  Alcohol Screen: Not on file  Housing: Not on file  Utilities: Not on file  Health Literacy: Not on file    Review of Systems  Constitutional:  Positive for appetite change and fatigue. Negative for fever.  Respiratory:  Negative for cough and shortness of breath.   Cardiovascular:  Negative for chest pain.  Gastrointestinal:  Positive for abdominal pain and nausea. Negative for constipation, diarrhea and vomiting.  Neurological:  Negative for light-headedness and headaches.         Objective    BP 119/79   Pulse 87   Temp 98.9 F (37.2 C)   Ht 5' 3 (1.6 m)   Wt 182 lb (82.6 kg)   SpO2 99%   BMI 32.24 kg/m   Physical Exam Constitutional:      General: She is not in acute distress.    Appearance: Normal appearance. She is obese. She is not ill-appearing.  HENT:     Head: Normocephalic and atraumatic.     Mouth/Throat:     Mouth: Mucous membranes are moist.     Pharynx: Oropharynx is clear.  Eyes:     Extraocular Movements: Extraocular movements intact.     Conjunctiva/sclera: Conjunctivae normal.  Cardiovascular:     Rate and Rhythm: Normal rate and regular rhythm.     Heart sounds: Normal heart sounds. No murmur heard. Pulmonary:     Effort: Pulmonary effort is normal.     Breath sounds: Normal breath sounds. No wheezing, rhonchi or rales.  Abdominal:     General: Abdomen is flat. Bowel sounds are normal.     Palpations: Abdomen is  soft.     Tenderness: There is generalized abdominal tenderness.  Musculoskeletal:     Right lower leg: No edema.     Left lower leg: No edema.  Skin:    General: Skin is warm and dry.  Neurological:     General: No focal deficit present.     Mental Status: She is alert and oriented to person, place, and time.  Psychiatric:        Mood and Affect: Mood normal. Affect is flat.        Behavior: Behavior normal.  Assessment & Plan:  Daily nausea Assessment & Plan: Persisting for 2-3 years, primarily in the mid-abdomen and lower abdomen. Symptoms include daily nausea, fatigue, and abdominal pain. Previous H. pylori infection partially treated, but symptoms persist. Possible contributing factors include poor diet, dehydration, and cannabis use. - Advised to follow up with GI specialist for retesting and management of H. pylori infection. - Encouraged dietary changes including increased intake of lean proteins, fruits, and vegetables. - Advised increasing water intake to improve hydration. - Recommended establishing a regular sleep schedule. - Discussed potential alternative nausea medications due to side effects of Reglan .  Orders: -     Ondansetron ; Take 1 tablet (4 mg total) by mouth every 8 (eight) hours as needed for nausea or vomiting.  Dispense: 30 tablet; Refill: 1 -     CBC with Differential/Platelet -     CMP14+EGFR -     Lipase  Cannabis dependence (HCC) Assessment & Plan: Daily cannabis use reported, which likely contributes to nausea and abdominal symptoms. She expresses interest in reducing use. Discussed that although symptoms improve during use, it is likely a cause of her daily symptoms.  - Advised gradual reduction in cannabis use, starting with every other day and progressing to less frequent use. - Discussed potential benefits of reducing cannabis use on nausea and overall health.   History of substance use Assessment & Plan: Opioid dependence managed with  methadone maintenance therapy for 4 years. Previously used fentanyl  for 3 years. Currently attending monthly follow-ups at [facility]. - Continue methadone maintenance therapy with monthly follow-ups at [facility].     Return in about 3 months (around 10/29/2024) for lifestyle .   Dwayne Begay, PA-C  "

## 2024-08-01 NOTE — Assessment & Plan Note (Signed)
 Daily cannabis use reported, which likely contributes to nausea and abdominal symptoms. She expresses interest in reducing use. Discussed that although symptoms improve during use, it is likely a cause of her daily symptoms.  - Advised gradual reduction in cannabis use, starting with every other day and progressing to less frequent use. - Discussed potential benefits of reducing cannabis use on nausea and overall health.

## 2024-09-08 ENCOUNTER — Ambulatory Visit: Payer: MEDICAID | Admitting: Obstetrics & Gynecology

## 2024-09-18 ENCOUNTER — Ambulatory Visit: Payer: MEDICAID | Admitting: Obstetrics & Gynecology

## 2024-10-29 ENCOUNTER — Ambulatory Visit: Payer: MEDICAID

## 2024-10-29 ENCOUNTER — Ambulatory Visit: Payer: MEDICAID | Admitting: Physician Assistant
